# Patient Record
Sex: Female | Born: 1955 | Race: Black or African American | Hispanic: No | Smoking: Never smoker
Health system: Southern US, Community
[De-identification: ages and names within clinical notes are randomized; demographics above are authoritative.]

## PROBLEM LIST (undated history)

## (undated) DIAGNOSIS — R011 Cardiac murmur, unspecified: Secondary | ICD-10-CM

## (undated) DIAGNOSIS — Z923 Personal history of irradiation: Secondary | ICD-10-CM

## (undated) DIAGNOSIS — I10 Essential (primary) hypertension: Secondary | ICD-10-CM

## (undated) DIAGNOSIS — E119 Type 2 diabetes mellitus without complications: Secondary | ICD-10-CM

## (undated) DIAGNOSIS — E7800 Pure hypercholesterolemia, unspecified: Secondary | ICD-10-CM

## (undated) HISTORY — DX: Type 2 diabetes mellitus without complications: E11.9

## (undated) HISTORY — PX: BREAST EXCISIONAL BIOPSY: SUR124

## (undated) HISTORY — DX: Essential (primary) hypertension: I10

## (undated) HISTORY — PX: TUBAL LIGATION: SHX77

## (undated) HISTORY — PX: BREAST LUMPECTOMY: SHX2

## (undated) HISTORY — PX: WISDOM TOOTH EXTRACTION: SHX21

## (undated) HISTORY — PX: ABDOMINAL HYSTERECTOMY: SHX81

---

## 1997-12-25 ENCOUNTER — Ambulatory Visit (HOSPITAL_COMMUNITY): Admission: RE | Admit: 1997-12-25 | Discharge: 1997-12-25 | Payer: Self-pay | Admitting: Family Medicine

## 1998-05-22 ENCOUNTER — Other Ambulatory Visit: Admission: RE | Admit: 1998-05-22 | Discharge: 1998-05-22 | Payer: Self-pay | Admitting: *Deleted

## 1998-12-28 ENCOUNTER — Encounter: Payer: Self-pay | Admitting: Family Medicine

## 1998-12-28 ENCOUNTER — Ambulatory Visit (HOSPITAL_COMMUNITY): Admission: RE | Admit: 1998-12-28 | Discharge: 1998-12-28 | Payer: Self-pay | Admitting: Family Medicine

## 1999-01-01 ENCOUNTER — Ambulatory Visit (HOSPITAL_COMMUNITY): Admission: RE | Admit: 1999-01-01 | Discharge: 1999-01-01 | Payer: Self-pay | Admitting: Family Medicine

## 1999-01-01 ENCOUNTER — Encounter: Payer: Self-pay | Admitting: Family Medicine

## 1999-05-24 ENCOUNTER — Other Ambulatory Visit: Admission: RE | Admit: 1999-05-24 | Discharge: 1999-05-24 | Payer: Self-pay | Admitting: *Deleted

## 2000-01-03 ENCOUNTER — Encounter: Payer: Self-pay | Admitting: Family Medicine

## 2000-01-03 ENCOUNTER — Ambulatory Visit (HOSPITAL_COMMUNITY): Admission: RE | Admit: 2000-01-03 | Discharge: 2000-01-03 | Payer: Self-pay | Admitting: Family Medicine

## 2001-01-04 ENCOUNTER — Ambulatory Visit (HOSPITAL_COMMUNITY): Admission: RE | Admit: 2001-01-04 | Discharge: 2001-01-04 | Payer: Self-pay | Admitting: Family Medicine

## 2001-01-04 ENCOUNTER — Encounter: Payer: Self-pay | Admitting: Family Medicine

## 2001-05-21 ENCOUNTER — Other Ambulatory Visit: Admission: RE | Admit: 2001-05-21 | Discharge: 2001-05-21 | Payer: Self-pay | Admitting: *Deleted

## 2002-01-07 ENCOUNTER — Ambulatory Visit (HOSPITAL_COMMUNITY): Admission: RE | Admit: 2002-01-07 | Discharge: 2002-01-07 | Payer: Self-pay | Admitting: Family Medicine

## 2002-01-07 ENCOUNTER — Encounter: Payer: Self-pay | Admitting: Family Medicine

## 2002-05-30 ENCOUNTER — Other Ambulatory Visit: Admission: RE | Admit: 2002-05-30 | Discharge: 2002-05-30 | Payer: Self-pay | Admitting: *Deleted

## 2003-01-13 ENCOUNTER — Ambulatory Visit (HOSPITAL_COMMUNITY): Admission: RE | Admit: 2003-01-13 | Discharge: 2003-01-13 | Payer: Self-pay | Admitting: Family Medicine

## 2004-01-19 ENCOUNTER — Ambulatory Visit (HOSPITAL_COMMUNITY): Admission: RE | Admit: 2004-01-19 | Discharge: 2004-01-19 | Payer: Self-pay | Admitting: Family Medicine

## 2005-01-20 ENCOUNTER — Ambulatory Visit (HOSPITAL_COMMUNITY): Admission: RE | Admit: 2005-01-20 | Discharge: 2005-01-20 | Payer: Self-pay | Admitting: Family Medicine

## 2006-01-24 ENCOUNTER — Ambulatory Visit (HOSPITAL_COMMUNITY): Admission: RE | Admit: 2006-01-24 | Discharge: 2006-01-24 | Payer: Self-pay | Admitting: Family Medicine

## 2007-01-30 ENCOUNTER — Ambulatory Visit (HOSPITAL_COMMUNITY): Admission: RE | Admit: 2007-01-30 | Discharge: 2007-01-30 | Payer: Self-pay | Admitting: Family Medicine

## 2007-02-02 ENCOUNTER — Encounter: Admission: RE | Admit: 2007-02-02 | Discharge: 2007-02-02 | Payer: Self-pay | Admitting: Family Medicine

## 2008-03-13 ENCOUNTER — Ambulatory Visit (HOSPITAL_COMMUNITY): Admission: RE | Admit: 2008-03-13 | Discharge: 2008-03-13 | Payer: Self-pay | Admitting: Family Medicine

## 2009-03-16 ENCOUNTER — Ambulatory Visit (HOSPITAL_COMMUNITY): Admission: RE | Admit: 2009-03-16 | Discharge: 2009-03-16 | Payer: Self-pay | Admitting: Family Medicine

## 2010-06-10 ENCOUNTER — Other Ambulatory Visit (HOSPITAL_COMMUNITY): Payer: Self-pay | Admitting: Family Medicine

## 2010-06-10 DIAGNOSIS — Z1231 Encounter for screening mammogram for malignant neoplasm of breast: Secondary | ICD-10-CM

## 2010-06-16 ENCOUNTER — Ambulatory Visit (HOSPITAL_COMMUNITY)
Admission: RE | Admit: 2010-06-16 | Discharge: 2010-06-16 | Disposition: A | Discharge status: Home / Self Care | Payer: Self-pay | Source: Ambulatory Visit | Attending: Family Medicine | Admitting: Family Medicine

## 2010-06-16 DIAGNOSIS — Z1231 Encounter for screening mammogram for malignant neoplasm of breast: Secondary | ICD-10-CM

## 2010-08-06 ENCOUNTER — Ambulatory Visit (INDEPENDENT_AMBULATORY_CARE_PROVIDER_SITE_OTHER): Payer: Self-pay

## 2010-08-06 ENCOUNTER — Inpatient Hospital Stay (INDEPENDENT_AMBULATORY_CARE_PROVIDER_SITE_OTHER)
Admission: RE | Admit: 2010-08-06 | Discharge: 2010-08-06 | Disposition: A | Discharge status: Home / Self Care | Payer: Self-pay | Source: Ambulatory Visit | Attending: Family Medicine | Admitting: Family Medicine

## 2010-08-06 DIAGNOSIS — W19XXXA Unspecified fall, initial encounter: Secondary | ICD-10-CM

## 2011-04-28 ENCOUNTER — Other Ambulatory Visit (HOSPITAL_COMMUNITY): Payer: Self-pay | Admitting: Family Medicine

## 2011-04-28 DIAGNOSIS — Z1231 Encounter for screening mammogram for malignant neoplasm of breast: Secondary | ICD-10-CM

## 2011-06-21 ENCOUNTER — Ambulatory Visit (HOSPITAL_COMMUNITY): Payer: Self-pay

## 2011-06-24 ENCOUNTER — Ambulatory Visit (HOSPITAL_COMMUNITY)
Admission: RE | Admit: 2011-06-24 | Discharge: 2011-06-24 | Disposition: A | Discharge status: Home / Self Care | Payer: Self-pay | Source: Ambulatory Visit | Attending: Family Medicine | Admitting: Family Medicine

## 2011-06-24 DIAGNOSIS — Z1231 Encounter for screening mammogram for malignant neoplasm of breast: Secondary | ICD-10-CM

## 2011-06-29 ENCOUNTER — Other Ambulatory Visit: Payer: Self-pay | Admitting: Family Medicine

## 2011-06-29 DIAGNOSIS — R928 Other abnormal and inconclusive findings on diagnostic imaging of breast: Secondary | ICD-10-CM

## 2011-07-22 ENCOUNTER — Ambulatory Visit
Admission: RE | Admit: 2011-07-22 | Discharge: 2011-07-22 | Disposition: A | Discharge status: Home / Self Care | Payer: No Typology Code available for payment source | Source: Ambulatory Visit | Attending: Family Medicine | Admitting: Family Medicine

## 2011-07-22 DIAGNOSIS — R928 Other abnormal and inconclusive findings on diagnostic imaging of breast: Secondary | ICD-10-CM

## 2014-01-23 ENCOUNTER — Other Ambulatory Visit (HOSPITAL_COMMUNITY): Payer: Self-pay | Admitting: Family Medicine

## 2014-01-23 DIAGNOSIS — Z1231 Encounter for screening mammogram for malignant neoplasm of breast: Secondary | ICD-10-CM

## 2014-01-28 ENCOUNTER — Ambulatory Visit (HOSPITAL_COMMUNITY)
Admission: RE | Admit: 2014-01-28 | Discharge: 2014-01-28 | Disposition: A | Discharge status: Home / Self Care | Payer: Self-pay | Source: Ambulatory Visit | Attending: Family Medicine | Admitting: Family Medicine

## 2014-01-28 DIAGNOSIS — Z1231 Encounter for screening mammogram for malignant neoplasm of breast: Secondary | ICD-10-CM

## 2016-02-03 ENCOUNTER — Other Ambulatory Visit: Payer: Self-pay | Admitting: Family Medicine

## 2016-02-03 DIAGNOSIS — Z23 Encounter for immunization: Secondary | ICD-10-CM | POA: Diagnosis not present

## 2016-02-03 DIAGNOSIS — I10 Essential (primary) hypertension: Secondary | ICD-10-CM | POA: Diagnosis not present

## 2016-02-03 DIAGNOSIS — E7800 Pure hypercholesterolemia, unspecified: Secondary | ICD-10-CM | POA: Diagnosis not present

## 2016-02-03 DIAGNOSIS — Z0000 Encounter for general adult medical examination without abnormal findings: Secondary | ICD-10-CM | POA: Diagnosis not present

## 2016-02-03 DIAGNOSIS — Z1231 Encounter for screening mammogram for malignant neoplasm of breast: Secondary | ICD-10-CM

## 2016-03-01 ENCOUNTER — Ambulatory Visit
Admission: RE | Admit: 2016-03-01 | Discharge: 2016-03-01 | Disposition: A | Discharge status: Home / Self Care | Payer: Self-pay | Source: Ambulatory Visit | Attending: Family Medicine | Admitting: Family Medicine

## 2016-03-01 DIAGNOSIS — Z1231 Encounter for screening mammogram for malignant neoplasm of breast: Secondary | ICD-10-CM | POA: Diagnosis not present

## 2016-05-18 DIAGNOSIS — I10 Essential (primary) hypertension: Secondary | ICD-10-CM | POA: Diagnosis not present

## 2016-06-29 DIAGNOSIS — I10 Essential (primary) hypertension: Secondary | ICD-10-CM | POA: Diagnosis not present

## 2017-02-16 DIAGNOSIS — E7800 Pure hypercholesterolemia, unspecified: Secondary | ICD-10-CM | POA: Diagnosis not present

## 2017-02-16 DIAGNOSIS — Z0000 Encounter for general adult medical examination without abnormal findings: Secondary | ICD-10-CM | POA: Diagnosis not present

## 2017-02-16 DIAGNOSIS — I10 Essential (primary) hypertension: Secondary | ICD-10-CM | POA: Diagnosis not present

## 2017-02-16 DIAGNOSIS — K625 Hemorrhage of anus and rectum: Secondary | ICD-10-CM | POA: Diagnosis not present

## 2017-03-22 DIAGNOSIS — R7301 Impaired fasting glucose: Secondary | ICD-10-CM | POA: Diagnosis not present

## 2017-03-22 DIAGNOSIS — J069 Acute upper respiratory infection, unspecified: Secondary | ICD-10-CM | POA: Diagnosis not present

## 2017-08-21 DIAGNOSIS — E1165 Type 2 diabetes mellitus with hyperglycemia: Secondary | ICD-10-CM | POA: Diagnosis not present

## 2017-08-21 DIAGNOSIS — E7800 Pure hypercholesterolemia, unspecified: Secondary | ICD-10-CM | POA: Diagnosis not present

## 2017-08-21 DIAGNOSIS — I10 Essential (primary) hypertension: Secondary | ICD-10-CM | POA: Diagnosis not present

## 2017-09-22 DIAGNOSIS — K640 First degree hemorrhoids: Secondary | ICD-10-CM | POA: Diagnosis not present

## 2017-09-22 DIAGNOSIS — Z1211 Encounter for screening for malignant neoplasm of colon: Secondary | ICD-10-CM | POA: Diagnosis not present

## 2017-12-07 ENCOUNTER — Other Ambulatory Visit: Payer: Self-pay | Admitting: Family Medicine

## 2017-12-07 DIAGNOSIS — Z23 Encounter for immunization: Secondary | ICD-10-CM | POA: Diagnosis not present

## 2017-12-07 DIAGNOSIS — Z1231 Encounter for screening mammogram for malignant neoplasm of breast: Secondary | ICD-10-CM

## 2017-12-07 DIAGNOSIS — E7800 Pure hypercholesterolemia, unspecified: Secondary | ICD-10-CM | POA: Diagnosis not present

## 2017-12-07 DIAGNOSIS — E1165 Type 2 diabetes mellitus with hyperglycemia: Secondary | ICD-10-CM | POA: Diagnosis not present

## 2017-12-07 DIAGNOSIS — I10 Essential (primary) hypertension: Secondary | ICD-10-CM | POA: Diagnosis not present

## 2018-01-22 ENCOUNTER — Ambulatory Visit
Admission: RE | Admit: 2018-01-22 | Discharge: 2018-01-22 | Disposition: A | Discharge status: Home / Self Care | Payer: BLUE CROSS/BLUE SHIELD | Source: Ambulatory Visit | Attending: Family Medicine | Admitting: Family Medicine

## 2018-01-22 DIAGNOSIS — Z1231 Encounter for screening mammogram for malignant neoplasm of breast: Secondary | ICD-10-CM

## 2018-03-14 DIAGNOSIS — E785 Hyperlipidemia, unspecified: Secondary | ICD-10-CM | POA: Diagnosis not present

## 2018-03-14 DIAGNOSIS — E119 Type 2 diabetes mellitus without complications: Secondary | ICD-10-CM | POA: Diagnosis not present

## 2018-03-14 DIAGNOSIS — Z0000 Encounter for general adult medical examination without abnormal findings: Secondary | ICD-10-CM | POA: Diagnosis not present

## 2018-03-14 DIAGNOSIS — I10 Essential (primary) hypertension: Secondary | ICD-10-CM | POA: Diagnosis not present

## 2018-09-12 DIAGNOSIS — E785 Hyperlipidemia, unspecified: Secondary | ICD-10-CM | POA: Diagnosis not present

## 2018-09-12 DIAGNOSIS — I10 Essential (primary) hypertension: Secondary | ICD-10-CM | POA: Diagnosis not present

## 2018-09-12 DIAGNOSIS — E119 Type 2 diabetes mellitus without complications: Secondary | ICD-10-CM | POA: Diagnosis not present

## 2018-09-26 DIAGNOSIS — I10 Essential (primary) hypertension: Secondary | ICD-10-CM | POA: Diagnosis not present

## 2018-12-24 ENCOUNTER — Other Ambulatory Visit: Payer: Self-pay | Admitting: Family Medicine

## 2018-12-24 DIAGNOSIS — Z1231 Encounter for screening mammogram for malignant neoplasm of breast: Secondary | ICD-10-CM

## 2019-02-14 ENCOUNTER — Ambulatory Visit
Admission: RE | Admit: 2019-02-14 | Discharge: 2019-02-14 | Disposition: A | Discharge status: Home / Self Care | Payer: BC Managed Care – PPO | Source: Ambulatory Visit | Attending: Family Medicine | Admitting: Family Medicine

## 2019-02-14 ENCOUNTER — Other Ambulatory Visit: Payer: Self-pay

## 2019-02-14 DIAGNOSIS — Z1231 Encounter for screening mammogram for malignant neoplasm of breast: Secondary | ICD-10-CM

## 2019-03-20 DIAGNOSIS — I10 Essential (primary) hypertension: Secondary | ICD-10-CM | POA: Diagnosis not present

## 2019-03-20 DIAGNOSIS — E1165 Type 2 diabetes mellitus with hyperglycemia: Secondary | ICD-10-CM | POA: Diagnosis not present

## 2019-03-20 DIAGNOSIS — Z0000 Encounter for general adult medical examination without abnormal findings: Secondary | ICD-10-CM | POA: Diagnosis not present

## 2019-03-20 DIAGNOSIS — E7800 Pure hypercholesterolemia, unspecified: Secondary | ICD-10-CM | POA: Diagnosis not present

## 2020-01-06 ENCOUNTER — Other Ambulatory Visit: Payer: Self-pay | Admitting: Family Medicine

## 2020-01-06 DIAGNOSIS — Z1231 Encounter for screening mammogram for malignant neoplasm of breast: Secondary | ICD-10-CM

## 2020-02-20 ENCOUNTER — Ambulatory Visit
Admission: RE | Admit: 2020-02-20 | Discharge: 2020-02-20 | Disposition: A | Discharge status: Home / Self Care | Payer: BC Managed Care – PPO | Source: Ambulatory Visit | Attending: Family Medicine | Admitting: Family Medicine

## 2020-02-20 ENCOUNTER — Other Ambulatory Visit: Payer: Self-pay

## 2020-02-20 DIAGNOSIS — Z1231 Encounter for screening mammogram for malignant neoplasm of breast: Secondary | ICD-10-CM

## 2021-02-14 DIAGNOSIS — C50919 Malignant neoplasm of unspecified site of unspecified female breast: Secondary | ICD-10-CM

## 2021-02-14 HISTORY — DX: Malignant neoplasm of unspecified site of unspecified female breast: C50.919

## 2021-04-07 ENCOUNTER — Other Ambulatory Visit: Payer: Self-pay | Admitting: Family Medicine

## 2021-04-07 DIAGNOSIS — Z1231 Encounter for screening mammogram for malignant neoplasm of breast: Secondary | ICD-10-CM

## 2021-04-12 ENCOUNTER — Ambulatory Visit
Admission: RE | Admit: 2021-04-12 | Discharge: 2021-04-12 | Disposition: A | Discharge status: Home / Self Care | Payer: Managed Care, Other (non HMO) | Source: Ambulatory Visit | Attending: Family Medicine | Admitting: Family Medicine

## 2021-04-12 DIAGNOSIS — Z1231 Encounter for screening mammogram for malignant neoplasm of breast: Secondary | ICD-10-CM

## 2021-04-13 ENCOUNTER — Other Ambulatory Visit: Payer: Self-pay | Admitting: Family Medicine

## 2021-04-13 DIAGNOSIS — R928 Other abnormal and inconclusive findings on diagnostic imaging of breast: Secondary | ICD-10-CM

## 2021-04-27 ENCOUNTER — Other Ambulatory Visit: Payer: Managed Care, Other (non HMO)

## 2021-05-25 ENCOUNTER — Ambulatory Visit: Payer: Managed Care, Other (non HMO)

## 2021-05-25 ENCOUNTER — Ambulatory Visit
Admission: RE | Admit: 2021-05-25 | Discharge: 2021-05-25 | Disposition: A | Discharge status: Home / Self Care | Payer: Managed Care, Other (non HMO) | Source: Ambulatory Visit | Attending: Family Medicine | Admitting: Family Medicine

## 2021-05-25 ENCOUNTER — Other Ambulatory Visit: Payer: Self-pay | Admitting: Family Medicine

## 2021-05-25 DIAGNOSIS — N6489 Other specified disorders of breast: Secondary | ICD-10-CM

## 2021-05-25 DIAGNOSIS — R928 Other abnormal and inconclusive findings on diagnostic imaging of breast: Secondary | ICD-10-CM

## 2021-06-07 ENCOUNTER — Other Ambulatory Visit: Payer: Managed Care, Other (non HMO)

## 2021-06-11 ENCOUNTER — Ambulatory Visit
Admission: RE | Admit: 2021-06-11 | Discharge: 2021-06-11 | Disposition: A | Discharge status: Home / Self Care | Payer: Managed Care, Other (non HMO) | Source: Ambulatory Visit | Attending: Family Medicine | Admitting: Family Medicine

## 2021-06-11 DIAGNOSIS — N6489 Other specified disorders of breast: Secondary | ICD-10-CM

## 2021-06-11 HISTORY — PX: BREAST BIOPSY: SHX20

## 2021-06-14 ENCOUNTER — Other Ambulatory Visit: Payer: Self-pay | Admitting: Family Medicine

## 2021-06-14 DIAGNOSIS — C50911 Malignant neoplasm of unspecified site of right female breast: Secondary | ICD-10-CM

## 2021-06-15 ENCOUNTER — Telehealth: Payer: Self-pay | Admitting: Hematology and Oncology

## 2021-06-15 NOTE — Telephone Encounter (Signed)
Spoke to patient to confirm morning clinic appointment for 5/10, packet will be sent via mail ?

## 2021-06-21 ENCOUNTER — Encounter: Payer: Self-pay | Admitting: *Deleted

## 2021-06-21 DIAGNOSIS — C50411 Malignant neoplasm of upper-outer quadrant of right female breast: Secondary | ICD-10-CM

## 2021-06-22 ENCOUNTER — Ambulatory Visit
Admission: RE | Admit: 2021-06-22 | Discharge: 2021-06-22 | Disposition: A | Discharge status: Home / Self Care | Payer: Commercial Managed Care - HMO | Source: Ambulatory Visit | Attending: Family Medicine | Admitting: Family Medicine

## 2021-06-22 DIAGNOSIS — C50911 Malignant neoplasm of unspecified site of right female breast: Secondary | ICD-10-CM

## 2021-06-22 NOTE — Progress Notes (Signed)
?Radiation Oncology         [phone] ?________________________________ ? ?Multidisciplinary Breast Oncology Clinic [facility]) ?Initial Outpatient Consultation ? ?Name: Alexandria Hoover MRN: [id number]  ?Date: 06/23/2021  DOB: 01-Jan-1956 ? ?HE:RDEY, Arbie Cookey, MD  Rolm Bookbinder, MD  ? ?REFERRING PHYSICIAN: Rolm Bookbinder, MD ? ?DIAGNOSIS: The encounter diagnosis was Malignant neoplasm of upper-outer quadrant of right breast in female, estrogen receptor positive ([place]). ? 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 ?  ICD-10-CM   ?1. Malignant neoplasm of upper-outer quadrant of right breast in female, estrogen receptor positive ([place])  C50.411   ? Z17.0   ?  ? ? ?HISTORY OF PRESENT ILLNESS::Alexandria Hoover is a 66 y.o. female who is presenting to the office today for evaluation of her newly diagnosed breast cancer. She is accompanied by no one. She is doing well overall.  ? ?She had routine screening mammography on 04/12/21 showing a possible abnormality in the right breast. She underwent unilateral right diagnostic mammography with tomography at [facility] on 05/25/21 showing: an indeterminate architectural distortion involving the upper outer quadrant of the right breast, middle depth, adjacent to a group of benign calcifications. ? ?Biopsy of the lateral right breast on 06/11/21 showed grade 1 invasive mammary carcinoma measuring 0.2 cm in the greatest linear extent, with mammary carcinoma in-situ. Prognostic indicators significant for: estrogen receptor, 100% positive and progesterone receptor, 100% positive, both with strong staining intensity. Proliferation marker Ki67 at 2%. HER2 negative. ? ?Menarche: not indicated ?LMP: not indicated ?Contraceptive: never used ?HRT: never used  ? ?The patient was referred today for presentation in the multidisciplinary conference.  Radiology studies and pathology slides were presented there for  review and discussion of treatment options.  A consensus was discussed regarding potential next steps. ? ?PREVIOUS RADIATION THERAPY: No ? ?PAST MEDICAL HISTORY:  ?Past Medical History:  ?Diagnosis Date  ? Diabetes ([place])   ? Hypertension   ? ? ?PAST SURGICAL HISTORY: ?Past Surgical History:  ?Procedure Laterality Date  ? ABDOMINAL HYSTERECTOMY    ? BREAST EXCISIONAL BIOPSY Right over 10 yrs  ? ? ?FAMILY HISTORY: sister with breast, diagnosed in her 27's ?Family History  ?Problem Relation Age of Onset  ? Throat cancer Father   ? Breast cancer Sister   ? Throat cancer Maternal Uncle   ? Pancreatic cancer Maternal Uncle   ? ? ?SOCIAL HISTORY:  ?Social History  ? ?Socioeconomic History  ? Marital status: Divorced  ?  Spouse name: Not on file  ? Number of children: Not on file  ? Years of education: Not on file  ? Highest education level: Not on file  ?Occupational History  ? Not on file  ?Tobacco Use  ? Smoking status: Never  ? Smokeless tobacco: Not on file  ?Substance and Sexual Activity  ? Alcohol use: Never  ? Drug use: Never  ? Sexual activity: Not on file  ?Other Topics Concern  ? Not on file  ?Social History Narrative  ? Not on file  ? ?Social Determinants of Health  ? ?Financial Resource Strain: Medium Risk  ? Difficulty of Paying Living Expenses: Somewhat hard  ?Food Insecurity: No Food Insecurity  ? Worried About Charity fundraiser in the Last Year: Never true  ? Ran Out of Food in the Last Year: Never true  ?Transportation Needs: No Transportation Needs  ? Lack of Transportation (Medical): No  ? Lack of  Transportation (Non-Medical): No  ?Physical Activity: Not on file  ?Stress: Not on file  ?Social Connections: Not on file  ? ? ?ALLERGIES:  ?Allergies  ?Allergen Reactions  ? Codeine Nausea And Vomiting  ? Lisinopril Other (See Comments)  ?  cough  ? ? ?MEDICATIONS:  ?Current Outpatient Medications  ?Medication Sig Dispense Refill  ? amLODipine (NORVASC) 10 MG tablet 1 tablet    ? cholecalciferol (VITAMIN D)  25 MCG (1000 UNIT) tablet 1 tablet    ? loratadine (CLARITIN) 10 MG tablet 1 tablet    ? losartan-hydrochlorothiazide (HYZAAR) 100-25 MG tablet Take 1 tablet by mouth daily.    ? metFORMIN (GLUCOPHAGE-XR) 500 MG 24 hr tablet SMARTSIG:1 Tablet(s) By Mouth Every Evening    ? pravastatin (PRAVACHOL) 40 MG tablet 1 tablet    ? ?No current facility-administered medications for this encounter.  ? ? ?REVIEW OF SYSTEMS: A 10+ POINT REVIEW OF SYSTEMS WAS OBTAINED including neurology, dermatology, psychiatry, cardiac, respiratory, lymph, extremities, GI, GU, musculoskeletal, constitutional, reproductive, HEENT. On the provided form, she reports night sweats, wearing glasses, feet swelling, sleeping with 2 pillows, a breast lump, breast dimpling, diabetes, and hot flashes. She denies any other symptoms.  ?  ?PHYSICAL EXAM:   ? ? 06/23/2021  ?Vitals with BMI   ?Height _0    ?Weight 194 lbs 14 oz   ?BMI 33.44   ?Systolic 616 !   ?Diastolic 69 !   ?Pulse 80   ?  ? ? ?Lungs are clear to auscultation bilaterally. Heart has regular rate and rhythm. No palpable cervical, supraclavicular, or axillary adenopathy. Abdomen soft, non-tender, normal bowel sounds. ?Breast: Left breast with no palpable mass, nipple discharge, or bleeding. Right breast with a scar in the upper inner aspect of the breast from a previous benign biopsy. Patient also has induration and biopsy changes in the upper outer quadrant with some erythema consistent with biopsy changes.  ? ?KPS = 90 ? ?100 - Normal; no complaints; no evidence of disease. ?90   - Able to carry on normal activity; minor signs or symptoms of disease. ?80   - Normal activity with effort; some signs or symptoms of disease. ?52   - Cares for self; unable to carry on normal activity or to do active work. ?60   - Requires occasional assistance, but is able to care for most of his personal needs. ?50   - Requires considerable assistance and frequent medical care. ?29   - Disabled; requires  special care and assistance. ?30   - Severely disabled; hospital admission is indicated although death not imminent. ?20   - Very sick; hospital admission necessary; active supportive treatment necessary. ?10   - Moribund; fatal processes progressing rapidly. ?0     - Dead ? ?Karnofsky DA, Abelmann WH, Craver LS and Burchenal [facility] [phone]) The use of the nitrogen mustards in the palliative treatment of carcinoma: with particular reference to bronchogenic carcinoma Cancer 1 [phone] ? ?LABORATORY DATA:  ?Lab Results  ?Component Value Date  ? WBC 7.0 06/23/2021  ? HGB 12.3 06/23/2021  ? HCT 39.5 06/23/2021  ? MCV 77.3 (L) 06/23/2021  ? PLT 361 06/23/2021  ? ?Lab Results  ?Component Value Date  ? NA 140 06/23/2021  ? K 3.3 (L) 06/23/2021  ? CL 103 06/23/2021  ? CO2 27 06/23/2021  ? ?Lab Results  ?Component Value Date  ? ALT 19 06/23/2021  ? AST 15 06/23/2021  ? ALKPHOS 68 06/23/2021  ? BILITOT 0.4 06/23/2021  ? ? ?  PULMONARY FUNCTION TEST:   ?Review Flowsheet   ? ?    ? View : No data to display.  ?  ?  ?  ?  ?  ? ? ?RADIOGRAPHY: MM DIAG BREAST TOMO UNI RIGHT ? ?Addendum Date: 06/22/2021   ?ADDENDUM REPORT: 06/22/2021 10:50 ADDENDUM: The architectural distortion in the UPPER-OUTER QUADRANT of the RIGHT breast is estimated to measure 1.7 centimeters. Electronically Signed   By: Nolon Nations M.D.   On: 06/22/2021 10:50  ? ?Result Date: 06/22/2021 ?CLINICAL DATA:  Recall from screening mammography, possible architectural distortion involving the upper outer RIGHT breast. Personal history of benign excisional biopsy from the RIGHT breast many years ago. EXAM: DIGITAL DIAGNOSTIC UNILATERAL RIGHT MAMMOGRAM WITH TOMOSYNTHESIS AND CAD TECHNIQUE: Right digital diagnostic mammography and breast tomosynthesis was performed. The images were evaluated with computer-aided detection. COMPARISON:  Previous exam(s). ACR Breast Density Category c: The breast tissue is heterogeneously dense, which may obscure small masses. FINDINGS:  Spot-compression CC and MLO views of the area of concern in the full field mediolateral view were obtained. A linear opaque marker was placed on the patient's skin at the site of the surgical scar. Persistent architectural

## 2021-06-23 ENCOUNTER — Ambulatory Visit: Payer: Medicare Other | Attending: General Surgery | Admitting: Physical Therapy

## 2021-06-23 ENCOUNTER — Inpatient Hospital Stay (HOSPITAL_BASED_OUTPATIENT_CLINIC_OR_DEPARTMENT_OTHER): Payer: Medicare Other | Admitting: Hematology and Oncology

## 2021-06-23 ENCOUNTER — Inpatient Hospital Stay: Payer: Medicare Other | Attending: Hematology and Oncology

## 2021-06-23 ENCOUNTER — Other Ambulatory Visit: Payer: Self-pay

## 2021-06-23 ENCOUNTER — Encounter: Payer: Self-pay | Admitting: *Deleted

## 2021-06-23 ENCOUNTER — Encounter: Payer: Self-pay | Admitting: Physical Therapy

## 2021-06-23 ENCOUNTER — Inpatient Hospital Stay: Payer: Medicare Other | Admitting: Licensed Clinical Social Worker

## 2021-06-23 ENCOUNTER — Other Ambulatory Visit: Payer: Self-pay | Admitting: *Deleted

## 2021-06-23 ENCOUNTER — Inpatient Hospital Stay (HOSPITAL_BASED_OUTPATIENT_CLINIC_OR_DEPARTMENT_OTHER): Payer: Medicare Other | Admitting: Genetic Counselor

## 2021-06-23 ENCOUNTER — Other Ambulatory Visit: Payer: Self-pay | Admitting: General Surgery

## 2021-06-23 ENCOUNTER — Encounter: Payer: Self-pay | Admitting: Hematology and Oncology

## 2021-06-23 ENCOUNTER — Ambulatory Visit: Payer: Managed Care, Other (non HMO) | Admitting: Radiation Oncology

## 2021-06-23 ENCOUNTER — Ambulatory Visit
Admission: RE | Admit: 2021-06-23 | Discharge: 2021-06-23 | Disposition: A | Discharge status: Home / Self Care | Payer: Commercial Managed Care - HMO | Source: Ambulatory Visit | Attending: Radiation Oncology | Admitting: Radiation Oncology

## 2021-06-23 DIAGNOSIS — M25611 Stiffness of right shoulder, not elsewhere classified: Secondary | ICD-10-CM | POA: Diagnosis present

## 2021-06-23 DIAGNOSIS — R293 Abnormal posture: Secondary | ICD-10-CM | POA: Diagnosis present

## 2021-06-23 DIAGNOSIS — Z808 Family history of malignant neoplasm of other organs or systems: Secondary | ICD-10-CM | POA: Diagnosis not present

## 2021-06-23 DIAGNOSIS — Z79899 Other long term (current) drug therapy: Secondary | ICD-10-CM

## 2021-06-23 DIAGNOSIS — Z170 Estrogen receptor positive status [ER+]: Secondary | ICD-10-CM | POA: Diagnosis present

## 2021-06-23 DIAGNOSIS — C50411 Malignant neoplasm of upper-outer quadrant of right female breast: Secondary | ICD-10-CM

## 2021-06-23 DIAGNOSIS — M25612 Stiffness of left shoulder, not elsewhere classified: Secondary | ICD-10-CM | POA: Diagnosis present

## 2021-06-23 DIAGNOSIS — Z8042 Family history of malignant neoplasm of prostate: Secondary | ICD-10-CM | POA: Diagnosis not present

## 2021-06-23 DIAGNOSIS — Z803 Family history of malignant neoplasm of breast: Secondary | ICD-10-CM | POA: Insufficient documentation

## 2021-06-23 DIAGNOSIS — Z800 Family history of malignant neoplasm of digestive organs: Secondary | ICD-10-CM | POA: Insufficient documentation

## 2021-06-23 LAB — CBC WITH DIFFERENTIAL (CANCER CENTER ONLY)
Abs Immature Granulocytes: 0.01 10*3/uL (ref 0.00–0.07)
Basophils Absolute: 0 10*3/uL (ref 0.0–0.1)
Basophils Relative: 0 %
Eosinophils Absolute: 0.4 10*3/uL (ref 0.0–0.5)
Eosinophils Relative: 5 %
HCT: 39.5 % (ref 36.0–46.0)
Hemoglobin: 12.3 g/dL (ref 12.0–15.0)
Immature Granulocytes: 0 %
Lymphocytes Relative: 35 %
Lymphs Abs: 2.4 10*3/uL (ref 0.7–4.0)
MCH: 24.1 pg — ABNORMAL LOW (ref 26.0–34.0)
MCHC: 31.1 g/dL (ref 30.0–36.0)
MCV: 77.3 fL — ABNORMAL LOW (ref 80.0–100.0)
Monocytes Absolute: 0.5 10*3/uL (ref 0.1–1.0)
Monocytes Relative: 7 %
Neutro Abs: 3.7 10*3/uL (ref 1.7–7.7)
Neutrophils Relative %: 53 %
Platelet Count: 361 10*3/uL (ref 150–400)
RBC: 5.11 MIL/uL (ref 3.87–5.11)
RDW: 15.6 % — ABNORMAL HIGH (ref 11.5–15.5)
WBC Count: 7 10*3/uL (ref 4.0–10.5)
nRBC: 0 % (ref 0.0–0.2)

## 2021-06-23 LAB — GENETIC SCREENING ORDER

## 2021-06-23 LAB — CMP (CANCER CENTER ONLY)
ALT: 19 U/L (ref 0–44)
AST: 15 U/L (ref 15–41)
Albumin: 4.6 g/dL (ref 3.5–5.0)
Alkaline Phosphatase: 68 U/L (ref 38–126)
Anion gap: 10 (ref 5–15)
BUN: 22 mg/dL (ref 8–23)
CO2: 27 mmol/L (ref 22–32)
Calcium: 10.5 mg/dL — ABNORMAL HIGH (ref 8.9–10.3)
Chloride: 103 mmol/L (ref 98–111)
Creatinine: 1 mg/dL (ref 0.44–1.00)
GFR, Estimated: >60 mL/min (ref >60)
Glucose, Bld: 165 mg/dL — ABNORMAL HIGH (ref 70–99)
Potassium: 3.3 mmol/L — ABNORMAL LOW (ref 3.5–5.1)
Sodium: 140 mmol/L (ref 135–145)
Total Bilirubin: 0.4 mg/dL (ref 0.3–1.2)
Total Protein: 8.4 g/dL — ABNORMAL HIGH (ref 6.5–8.1)

## 2021-06-23 NOTE — Assessment & Plan Note (Addendum)
This is a very pleasant 66 year old postmenopausal female patient with newly diagnosed right breast invasive ductal carcinoma, grade 1 ER/PR positive, KI of 2%, HER2 FISH pending referred to breast [place] for recommendations.  Given small tumor, architectural distortion measuring about 1.7 cm, strong ER/PR positivity, HER2 FISH pending, it is reasonable to proceed with upfront surgery provided HER2 remains negative. ? ?Postsurgery, we can consider Oncotype testing and we have discussed the following details about Oncotype. We have discussed about Oncotype Dx score which is a well validated prognostic scoring system which can predict outcome with endocrine therapy alone and whether chemotherapy reduces recurrence.  Typically in patients with ER positive cancers that are node negative if the RS score is high typically greater than or equal to 26, chemotherapy is recommended.  ?In women with intermediate recurrence score younger than 23, there can still be some role for chemotherapy in addition to endocrine therapy especially if the recurrence score is between 21-25. ?  Given her pathology, it is very less likely that she will need any adjuvant chemotherapy but she is interested in Oncotype testing to know more about prognostic information. ? ?We have discussed about considering antiestrogen therapy after completion of adjuvant radiation.  We have discussed the following details about antiestrogen therapy. ? ?We have discussed options for antiestrogen therapy today ? ?With regards to Tamoxifen, we discussed that this is a SERM, selective estrogen receptor modulator. We discussed mechanism of action of Tamoxifen, adverse effects on Tamoxifen including but not limited to post menopausal symptoms, increased risk of DVT/PE, increased risk of endometrial cancer, questionable cataracts with long term use and increased risk of cardiovascular events in the study which was not statistically significant. A benefit from Tamoxifen  would be improvement in bone density. ? ?With regards to aromatase inhibitors, we discussed mechanism of action, adverse effects including but not limited to post menopausal symptoms, arthralgias, myalgias, increased risk of cardiovascular events and bone loss.  ? ?She will return to clinic after surgery to discuss pathology results as well as Oncotype results. ? ?She does not have any preference for the type of antiestrogen therapy.  We will discuss more after she completes adjuvant radiation. ? ? ? ?

## 2021-06-23 NOTE — Progress Notes (Signed)
[facility] ?CONSULT NOTE ? ?Patient Care Team: ?Maurice Small, MD as PCP - General (Family Medicine) ?Rolm Bookbinder, MD as Consulting Physician (General Surgery) ?Benay Pike, MD as Consulting Physician (Hematology and Oncology) ?Gery Pray, MD as Consulting Physician (Radiation Oncology) ?Mauro Kaufmann, RN as Oncology Nurse Navigator ?Rockwell Germany, RN as Oncology Nurse Navigator ? ?CHIEF COMPLAINTS/PURPOSE OF CONSULTATION:  ?Newly diagnosed breast cancer ? ?HISTORY OF PRESENTING ILLNESS:  ?Alexandria Hoover 66 y.o. female is here because of recent diagnosis of right IDC breast cancer ? ?I reviewed her records extensively and collaborated the history with the patient. ? ?SUMMARY OF ONCOLOGIC HISTORY: ?Oncology History  ?Malignant neoplasm of upper-outer quadrant of right breast in female, estrogen receptor positive ([place])  ?04/12/2021 Mammogram  ? Mammogram from April 12, 2021 showed possible distortion in the right breast and additional imaging was recommended. ?Diagnostic mammogram confirmed indeterminate architectural distortion involving the upper outer quadrant of the right breast at middle depth adjacent to a group of benign calcifications.  Stereotactic tomosynthesis core needle biopsy of the right breast was recommended ?  ?06/11/2021 Pathology Results  ? Pathology from the breast biopsy showed invasive mammary carcinoma ductal subtype, grade 1.  Prognostic showed ER 100% positive strong staining PR 100% positive strong staining, KI of 2% and HER2 by FISH pending ?  ?06/21/2021 Initial Diagnosis  ? Malignant neoplasm of upper-outer quadrant of right breast in female, estrogen receptor positive ([place]) ?  ? ? ?MEDICAL HISTORY:  ?Past Medical History:  ?Diagnosis Date  ? Diabetes ([place])   ? Hypertension   ? ? ?SURGICAL HISTORY: ?Past Surgical History:  ?Procedure Laterality Date  ? ABDOMINAL HYSTERECTOMY    ? BREAST EXCISIONAL BIOPSY Right over 10 yrs  ? ? ?SOCIAL HISTORY: ?Social  History  ? ?Socioeconomic History  ? Marital status: Divorced  ?  Spouse name: Not on file  ? Number of children: Not on file  ? Years of education: Not on file  ? Highest education level: Not on file  ?Occupational History  ? Not on file  ?Tobacco Use  ? Smoking status: Never  ? Smokeless tobacco: Not on file  ?Substance and Sexual Activity  ? Alcohol use: Never  ? Drug use: Never  ? Sexual activity: Not on file  ?Other Topics Concern  ? Not on file  ?Social History Narrative  ? Not on file  ? ?Social Determinants of Health  ? ?Financial Resource Strain: Not on file  ?Food Insecurity: Not on file  ?Transportation Needs: Not on file  ?Physical Activity: Not on file  ?Stress: Not on file  ?Social Connections: Not on file  ?Intimate Partner Violence: Not on file  ? ? ?FAMILY HISTORY: ?Family History  ?Problem Relation Age of Onset  ? Throat cancer Father   ? Breast cancer Sister   ? Throat cancer Maternal Uncle   ? Pancreatic cancer Maternal Uncle   ? ? ?ALLERGIES:  is allergic to codeine and lisinopril. ? ?MEDICATIONS:  ?Current Outpatient Medications  ?Medication Sig Dispense Refill  ? cholecalciferol (VITAMIN D) 25 MCG (1000 UNIT) tablet 1 tablet    ? amLODipine (NORVASC) 10 MG tablet 1 tablet    ? loratadine (CLARITIN) 10 MG tablet 1 tablet    ? losartan-hydrochlorothiazide (HYZAAR) 100-25 MG tablet Take 1 tablet by mouth daily.    ? metFORMIN (GLUCOPHAGE-XR) 500 MG 24 hr tablet SMARTSIG:1 Tablet(s) By Mouth Every Evening    ? pravastatin (PRAVACHOL) 40 MG tablet 1 tablet    ? ?  No current facility-administered medications for this visit.  ? ? ?REVIEW OF SYSTEMS:   ?Constitutional: Denies fevers, chills or abnormal night sweats ?Eyes: Denies blurriness of vision, double vision or watery eyes ?Ears, nose, mouth, throat, and face: Denies mucositis or sore throat ?Respiratory: Denies cough, dyspnea or wheezes ?Cardiovascular: Denies palpitation, chest discomfort or lower extremity swelling ?Gastrointestinal:  Denies  nausea, heartburn or change in bowel habits ?Skin: Denies abnormal skin rashes ?Lymphatics: Denies new lymphadenopathy or easy bruising ?Neurological:Denies numbness, tingling or new weaknesses ?Behavioral/Psych: Mood is stable, no new changes  ?Breast: Denies any palpable lumps or discharge ?All other systems were reviewed with the patient and are negative. ? ?PHYSICAL EXAMINATION: ?ECOG PERFORMANCE STATUS: 0 - Asymptomatic ? ?Vitals:  ? 06/23/21 0853  ?BP: (!) 153/69  ?Pulse: 80  ?Resp: 16  ?Temp: 97.6 ?F (36.4 ?C)  ?SpO2: 97%  ? ?Filed Weights  ? 06/23/21 0853  ?Weight: 194 lb 14.2 oz (88.4 kg)  ? ? ?Physical Exam ?Constitutional:   ?   Appearance: Normal appearance.  ?Chest:  ? ? ?   Comments: Right breast upper outer quadrant mass measuring about 4 *3 cms, easily movable over pectoralis, skin pinchable. ?No palpable axillary LN. ?Since the tumor is smaller on imaging, some of this could represent hematoma ?Musculoskeletal:     ?   General: No swelling or tenderness.  ?   Cervical back: Normal range of motion and neck supple. No rigidity.  ?Lymphadenopathy:  ?   Cervical: No cervical adenopathy.  ?Neurological:  ?   Mental Status: She is alert.  ? ? ? ?LABORATORY DATA:  ?I have reviewed the data as listed ?Lab Results  ?Component Value Date  ? WBC 7.0 06/23/2021  ? HGB 12.3 06/23/2021  ? HCT 39.5 06/23/2021  ? MCV 77.3 (L) 06/23/2021  ? PLT 361 06/23/2021  ? ?Lab Results  ?Component Value Date  ? NA 140 06/23/2021  ? K 3.3 (L) 06/23/2021  ? CL 103 06/23/2021  ? CO2 27 06/23/2021  ? ? ?RADIOGRAPHIC STUDIES: ?I have personally reviewed the radiological reports and agreed with the findings in the report. ? ?ASSESSMENT AND PLAN:  ?Malignant neoplasm of upper-outer quadrant of right breast in female, estrogen receptor positive ([place]) ?This is a very pleasant 66 year old postmenopausal female patient with newly diagnosed right breast invasive ductal carcinoma, grade 1 ER/PR positive, KI of 2%, HER2 FISH pending  referred to breast [facility] for recommendations.  Given small tumor, architectural distortion measuring about 1.7 cm, strong ER/PR positivity, HER2 FISH pending, it is reasonable to proceed with upfront surgery provided HER2 remains negative. ? ?Postsurgery, we can consider Oncotype testing and we have discussed the following details about Oncotype. We have discussed about Oncotype Dx score which is a well validated prognostic scoring system which can predict outcome with endocrine therapy alone and whether chemotherapy reduces recurrence.  Typically in patients with ER positive cancers that are node negative if the RS score is high typically greater than or equal to 26, chemotherapy is recommended.  ?In women with intermediate recurrence score younger than 66, there can still be some role for chemotherapy in addition to endocrine therapy especially if the recurrence score is between 21-25. ?  Given her pathology, it is very less likely that she will need any adjuvant chemotherapy but she is interested in Oncotype testing to know more about prognostic information. ? ?We have discussed about considering antiestrogen therapy after completion of adjuvant radiation.  We have discussed the following details about  antiestrogen therapy. ? ?We have discussed options for antiestrogen therapy today ? ?With regards to Tamoxifen, we discussed that this is a SERM, selective estrogen receptor modulator. We discussed mechanism of action of Tamoxifen, adverse effects on Tamoxifen including but not limited to post menopausal symptoms, increased risk of DVT/PE, increased risk of endometrial cancer, questionable cataracts with long term use and increased risk of cardiovascular events in the study which was not statistically significant. A benefit from Tamoxifen would be improvement in bone density. ? ?With regards to aromatase inhibitors, we discussed mechanism of action, adverse effects including but not limited to post menopausal  symptoms, arthralgias, myalgias, increased risk of cardiovascular events and bone loss.  ? ?She will return to clinic after surgery to discuss pathology results as well as Oncotype results. ? ?She does not have any pre

## 2021-06-23 NOTE — Research (Signed)
Trial:  [employer] 2021-05 - Specimen Collection Study to Evaluate Biomarkers in Subjects with Cancer   ?Patient Alexandria Hoover was identified by Dr. Iruku as a potential candidate for the above listed study.  This Clinical Research Nurse met with [name], [id number], on 06/23/21 in a manner and location that ensures patient privacy to discuss participation in the above listed research study.  Patient is Unaccompanied.  A copy of the informed consent document with embedded HIPAA language was provided to the patient.  Patient reads, speaks, and understands English.   ?Patient was provided with the business card of this Nurse and encouraged to contact the research team with any questions.  Approximately 5 minutes were spent with the patient reviewing the informed consent documents.  Patient was provided the option of taking informed consent documents home to review and was encouraged to review at their convenience with their support network, including other care providers. Patient took the consent documents home to review. ?Cameo Windham, BSN, RN, CCRP ?Clinical Research Nurse II ?06/23/2021 11:10 AM ?  ?

## 2021-06-23 NOTE — Progress Notes (Signed)
[facility] Clinical Social Work  ?Initial Assessment ? ? ?Alexandria Hoover is a 66 y.o. year old female presenting alone. Clinical Social Work was referred by  [facility]  for assessment of psychosocial needs.  ? ?SDOH (Social Determinants of Health) assessments performed: Yes ?SDOH Interventions   ? ?Flowsheet Row Most Recent Value  ?SDOH Interventions   ?Food Insecurity Interventions Intervention Not Indicated  ?Financial Strain Interventions Other (Comment)  [cancer foundations]  ?Housing Interventions Intervention Not Indicated  ?Transportation Interventions Intervention Not Indicated, Patient Resources (Friends/Family)  ? ?  ?  ?Distress Screen completed: Yes ? ?  06/23/2021  ? 12:08 PM  ?ONCBCN DISTRESS SCREENING  ?Screening Type Initial Screening  ?Distress experienced in past week (1-10) 0  ?Practical problem type Insurance;Work/school;Food  ?Emotional problem type Adjusting to illness  ?Information Concerns Type Lack of info about diagnosis;Lack of info about treatment  ?Physical Problem type Pain;Nausea/vomiting;Getting around;Bathing/dressing;Breathing;Mouth sores/swallowing;Loss of appetitie;Talking;Constipation/diarrhea;Swollen arms/legs  ? ? ? ? ?Family/Social Information:  ?Housing Arrangement: patient lives with her sister ?Family members/support persons in your life? Family, Friends, and pastor ?Transportation concerns: no, has family who can help if needed  ?Employment: Working full time as a Radiation protection practitioner. Income source: Employment. Not sure if she has benefits ?Financial concerns:  Potentially depending on medical costs ?Type of concern: Medical bills ?Food access concerns: no ?Religious or spiritual practice: yes, has support from her pastor ?Services Currently in place:  n/a ? ?Coping/ Adjustment to diagnosis: ?Patient understands treatment plan and what happens next? yes ?Concerns about diagnosis and/or treatment: How I will pay for the services I need ?Patient reported stressors: Insurance, Work/ school, and  Adjusting to my illness ?Patient enjoys time with family/ friends ?Current coping skills/ strengths: Capable of independent living  and Motivation for treatment/growth  ? ? ? SUMMARY: ?Current SDOH Barriers:  ?Potential concerns with medical costs ? ?Clinical Social Work Clinical Goal(s):  ?Patient will complete applications for assistance and submit with assistance from [facility] ? ?Interventions: ?Discussed common feeling and emotions when being diagnosed with cancer, and the importance of support during treatment ?Informed patient of the support team roles and support services at [facility] ?Provided CSW contact information and encouraged patient to call with any questions or concerns ?Provided patient with information about breast cancer foundations for potential financial assistance ? ? ?Follow Up Plan: Patient will work on applications and contact CSW with any questions or for help submitting ?Patient verbalizes understanding of plan: Yes ? ? ? ?Cassondra Stachowski E Ozetta Flatley, LCSW ?

## 2021-06-23 NOTE — Therapy (Signed)
?OUTPATIENT PHYSICAL THERAPY BREAST CANCER BASELINE EVALUATION ? ? ?Patient Name: Alexandria Hoover ?MRN: [id number] ?DOB:[birth year], 66 y.o., female ?Today's Date: 06/23/2021 ? ? PT End of Session - 06/23/21 1030   ? ? Visit Number 1   ? Number of Visits 2   ? Date for PT Re-Evaluation 08/18/21   ? PT Start Time [phone]   ? PT Stop Time 1016   ? PT Time Calculation (min) 23 min   ? Activity Tolerance Patient tolerated treatment well   ? Behavior During Therapy [facility] for tasks assessed/performed   ? ?  ?  ? ?  ? ? ?Past Medical History:  ?Diagnosis Date  ? Diabetes ([place])   ? Hypertension   ? ?Past Surgical History:  ?Procedure Laterality Date  ? ABDOMINAL HYSTERECTOMY    ? BREAST EXCISIONAL BIOPSY Right over 10 yrs  ? ?Patient Active Problem List  ? Diagnosis Date Noted  ? Malignant neoplasm of upper-outer quadrant of right breast in female, estrogen receptor positive ([place]) 06/21/2021  ? ? ?PCP: Dr. Tommi Rumps ? ?REFERRING PROVIDER: Dr. Rolm Bookbinder ? ?REFERRING DIAG: Right breast cancer ? ?THERAPY DIAG:  ?Malignant neoplasm of upper-outer quadrant of right breast in female, estrogen receptor positive ([place]) ? ?Abnormal posture ? ?Stiffness of left shoulder, not elsewhere classified ? ?Stiffness of right shoulder, not elsewhere classified ? ?ONSET DATE: 04/12/2021 ? ?SUBJECTIVE                                                                                                                                                                                          ? ?SUBJECTIVE STATEMENT: ?Patient reports she is here today to be seen by her medical team for her newly diagnosed right breast cancer.  ? 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 It measures 1.7 cm and is located in the upper outer quadrant. It is ER/PR positive and HER2 negative with a Ki67 of 2%. Also has hypertension and diabetes. ? ?PATIENT GOALS   reduce lymphedema risk and learn post op  HEP.  ? ?PAIN:  ?Are you having pain? No ? ? ?PRECAUTIONS: Active CA None ? ?HAND DOMINANCE: right ? ?WEIGHT BEARING RESTRICTIONS No ? ?FALLS:  ?Has patient fallen in last 6 months? No ? ?LIVING ENVIRONMENT: ?Patient lives with: her sister ?Lives in: House/apartment ?Has following equipment at home: None ? ?OCCUPATION: full time packer ? ?LEISURE: She does not exercise ? ?PRIOR LEVEL OF FUNCTION: Independent ? ? ?OBJECTIVE ? ?COGNITION: ? Overall cognitive status: Within functional limits for tasks assessed   ? ?POSTURE:  ?Forward head and rounded shoulders  posture ? ?UPPER EXTREMITY AROM/PROM: ? ?A/PROM RIGHT  06/23/2021 ?  ?Shoulder extension 48  ?Shoulder flexion 145  ?Shoulder abduction 125  ?Shoulder internal rotation 82  ?Shoulder external rotation 48  ?  (Blank rows = not tested) ? ?A/PROM LEFT  06/23/2021  ?Shoulder extension 53  ?Shoulder flexion 125  ?Shoulder abduction 126  ?Shoulder internal rotation 70  ?Shoulder external rotation 56  ?  (Blank rows = not tested) ? ? ?CERVICAL AROM: ?All within normal limits:  ? ? Percent limited  ?Flexion WNL  ?Extension 75% limited  ?Right lateral flexion 25% limited  ?Left lateral flexion 50% limited  ?Right rotation 25% limited  ?Left rotation 25% limited  ? ? ? ?UPPER EXTREMITY STRENGTH: WNL ? ? ?LYMPHEDEMA ASSESSMENTS:  ? ?[place] RIGHT  06/23/2021  ?10 cm proximal to olecranon process 35.3  ?Olecranon process 30.1  ?10 cm proximal to ulnar styloid process 26  ?Just proximal to ulnar styloid process 18.7  ?Across hand at thumb web space 20.4  ?At base of 2nd digit 6.8  ?(Blank rows = not tested) ? ?[place] LEFT  06/23/2021  ?10 cm proximal to olecranon process 36.8  ?Olecranon process 30.7  ?10 cm proximal to ulnar styloid process 26.2  ?Just proximal to ulnar styloid process 20.5  ?Across hand at thumb web space 20.4  ?At base of 2nd digit 6.8  ?(Blank rows = not tested) ? ? ?L-DEX LYMPHEDEMA SCREENING: ? ?The patient was assessed using the L-Dex machine today to  produce a lymphedema index baseline score. The patient will be reassessed on a regular basis (typically every 3 months) to obtain new L-Dex scores. If the score is > 6.5 points away from his/her baseline score indicating onset of subclinical lymphedema, it will be recommended to wear a compression garment for 4 weeks, 12 hours per day and then be reassessed. If the score continues to be > 6.5 points from baseline at reassessment, we will initiate lymphedema treatment. Assessing in this manner has a 95% rate of preventing clinically significant lymphedema. ? ? L-DEX FLOWSHEETS - 06/23/21 1000   ? ?  ? L-DEX LYMPHEDEMA SCREENING  ? Measurement Type Unilateral   ? L-DEX MEASUREMENT EXTREMITY Upper Extremity   ? POSITION  Standing   ? DOMINANT SIDE Right   ? At Risk Side Right   ? BASELINE SCORE (UNILATERAL) 0   ? ?  ?  ? ?  ? ? ? ?QUICK DASH SURVEY: ? Katina Dung - 06/23/21 0001   ? ? Open a tight or new jar Mild difficulty   ? Do heavy household chores (wash walls, wash floors) No difficulty   ? Carry a shopping bag or briefcase No difficulty   ? Wash your back No difficulty   ? Use a knife to cut food No difficulty   ? Recreational activities in which you take some force or impact through your arm, shoulder, or hand (golf, hammering, tennis) No difficulty   ? During the past week, to what extent has your arm, shoulder or hand problem interfered with your normal social activities with family, friends, neighbors, or groups? Not at all   ? During the past week, to what extent has your arm, shoulder or hand problem limited your work or other regular daily activities Not at all   ? Arm, shoulder, or hand pain. None   ? Tingling (pins and needles) in your arm, shoulder, or hand None   ? Difficulty Sleeping No difficulty   ? DASH  Score 2.27 %   ? ?  ?  ? ?  ? ? ? ?PATIENT EDUCATION:  ?Education details: Lymphedema risk reduction and post op shoulder/posture HEP ?Person educated: Patient ?Education method: Explanation,  Demonstration, Handout ?Education comprehension: Patient verbalized understanding and returned demonstration ? ? ?HOME EXERCISE PROGRAM: ?Patient was instructed today in a home exercise program today for post op shoulder range of motion. These included active assist shoulder flexion in sitting, scapular retraction, wall walking with shoulder abduction, and hands behind head external rotation.  She was encouraged to do these twice a day, holding 3 seconds and repeating 5 times when permitted by her physician. ? ? ?ASSESSMENT: ? 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 It measures 1.7 cm and is located in the upper outer quadrant. It is ER/PR positive and HER2 negative with a Ki67 of 2%. Also has hypertension and diabetes.Her multidisciplinary medical team met prior to her assessments to determine a recommended treatment plan. She is planning to have a right lumpectomy and sentinel node biopsy followed by radiation and anti-estrogen therapy. She will benefit from a post op PT reassessment to determine needs and from L-Dex screens every 3 months for 2 years to detect subclinical lymphedema. ? ?Pt will benefit from skilled therapeutic intervention to improve on the following deficits: Decreased knowledge of precautions, impaired UE functional use, pain, decreased ROM, postural dysfunction.  ? ?PT treatment/interventions: ADL/self-care home management, pt/family education, therapeutic exercise ? ?REHAB POTENTIAL: Excellent ? ?CLINICAL DECISION MAKING: Stable/uncomplicated ? ?EVALUATION COMPLEXITY: Low ? ? ?GOALS: ?Goals reviewed with patient? YES ? ?LONG TERM GOALS: (STG=LTG) ? ? Name Target Date Goal status  ?1 Pt will be able to verbalize understanding of pertinent lymphedema risk reduction practices relevant to her dx specifically related to skin care.  ?Baseline:  No knowledge 06/23/2021 Achieved at eval  ?2 Pt will be able to return demo  and/or verbalize understanding of the post op HEP related to regaining shoulder ROM. ?Baseline:  No knowledge 06/23/2021 Achieved at eval  ?3 Pt will be able to verbalize understanding of the importance of attend

## 2021-06-24 ENCOUNTER — Encounter: Payer: Self-pay | Admitting: Genetic Counselor

## 2021-06-24 DIAGNOSIS — Z803 Family history of malignant neoplasm of breast: Secondary | ICD-10-CM | POA: Insufficient documentation

## 2021-06-24 DIAGNOSIS — Z8042 Family history of malignant neoplasm of prostate: Secondary | ICD-10-CM | POA: Insufficient documentation

## 2021-06-24 NOTE — Progress Notes (Signed)
REFERRING PROVIDER: ?Benay Pike, MD ?[address] ?[place], [place] [address] ? ?PRIMARY PROVIDER:  ?Maurice Small, MD ? ?PRIMARY REASON FOR VISIT:  ?1. Malignant neoplasm of upper-outer quadrant of right breast in female, estrogen receptor positive ([place])   ?2. Family history of breast cancer   ?3. Family history of prostate cancer   ? ?HISTORY OF PRESENT ILLNESS:   ?Alexandria Hoover, a 66 y.o. female, was seen for a [place] cancer genetics consultation during the breast multidisciplinary clinic at the request of Dr. Chryl Heck due to a personal and family history of cancer.  [name] presents to clinic today to discuss the possibility of a hereditary predisposition to cancer, to discuss genetic testing, and to further clarify her future cancer risks, as well as potential cancer risks for family members.  ? ?In April 2023, at the age of 78, [name] was diagnosed with invasive ductal carcinoma of the right breast.  ? ?CANCER HISTORY:  ?Oncology History  ?Malignant neoplasm of upper-outer quadrant of right breast in female, estrogen receptor positive ([place])  ?04/12/2021 Mammogram  ? Mammogram from April 12, 2021 showed possible distortion in the right breast and additional imaging was recommended. ?Diagnostic mammogram confirmed indeterminate architectural distortion involving the upper outer quadrant of the right breast at middle depth adjacent to a group of benign calcifications.  Stereotactic tomosynthesis core needle biopsy of the right breast was recommended ?  ?06/11/2021 Pathology Results  ? Pathology from the breast biopsy showed invasive mammary carcinoma ductal subtype, grade 1.  Prognostic showed ER 100% positive strong staining PR 100% positive strong staining, KI of 2% and HER2 by FISH pending ?  ?06/21/2021 Initial Diagnosis  ? Malignant neoplasm of upper-outer quadrant of right breast in female, estrogen receptor positive ([place]) ?  ?06/23/2021 Cancer Staging  ? Staging form: Breast, AJCC 8th  Edition ?- Clinical stage from 06/23/2021: Stage IA (cT1c, cN0, cM0, G1, ER+, PR+, HER2-) - Signed by Benay Pike, MD on 06/23/2021 ?Stage prefix: Initial diagnosis ?Histologic grading system: 3 grade system ?Laterality: Right ?Staged by: Pathologist and managing physician ?Stage used in treatment planning: Yes ?National guidelines used in treatment planning: Yes ?Type of national guideline used in treatment planning: NCCN ? ?  ? ? ? ?RISK FACTORS:  ?OCP use for approximately 0 years.  ?Uterus intact: no.  ?Menopausal status: postmenopausal.  ?HRT use: 0 years. ?Colonoscopy: yes ?Mammogram within the last year: yes. ?Any excessive radiation exposure in the past: no ? ?Past Medical History:  ?Diagnosis Date  ? Diabetes ([place])   ? Hypertension   ? ? ?Past Surgical History:  ?Procedure Laterality Date  ? ABDOMINAL HYSTERECTOMY    ? BREAST EXCISIONAL BIOPSY Right over 10 yrs  ? ? ?Social History  ? ?Socioeconomic History  ? Marital status: Divorced  ?  Spouse name: Not on file  ? Number of children: Not on file  ? Years of education: Not on file  ? Highest education level: Not on file  ?Occupational History  ? Not on file  ?Tobacco Use  ? Smoking status: Never  ? Smokeless tobacco: Not on file  ?Substance and Sexual Activity  ? Alcohol use: Never  ? Drug use: Never  ? Sexual activity: Not on file  ?Other Topics Concern  ? Not on file  ?Social History Narrative  ? Not on file  ? ?Social Determinants of Health  ? ?Financial Resource Strain: Medium Risk  ? Difficulty of Paying Living Expenses: Somewhat hard  ?Food Insecurity: No Food  Insecurity  ? Worried About Charity fundraiser in the Last Year: Never true  ? Ran Out of Food in the Last Year: Never true  ?Transportation Needs: No Transportation Needs  ? Lack of Transportation (Medical): No  ? Lack of Transportation (Non-Medical): No  ?Physical Activity: Not on file  ?Stress: Not on file  ?Social Connections: Not on file  ?  ? ?FAMILY HISTORY:  ?We obtained a detailed,  4-generation family history.  Significant diagnoses are listed below: ?Family History  ?Problem Relation Age of Onset  ? Throat cancer Father   ? Breast cancer Sister 38  ? Pancreatic cancer Brother   ?     maternal half-brother  ? Throat cancer Brother   ?     maternal half-brother  ? Breast cancer Paternal Aunt   ? Prostate cancer Paternal Uncle   ? Prostate cancer Cousin   ? ? ? ?Ms. Biddinger's sister was diagnosed with breast cancer at age 42. Her maternal half-brother was diagnosed with pancreatic cancer and a second maternal half-brother was diagnosed with throat cancer. Ms. Procida father was diagnosed with throat cancer, he smoked and died at age 5. Her paternal aunt was diagnosed with breast cancer, she is deceased. Her paternal uncle was diagnosed with prostate cancer, he is deceased. She has one paternal cousin who was diagnosed with prostate cancer. Ms. Ke is unaware of previous family history of genetic testing for hereditary cancer risks. There is no reported Ashkenazi Jewish ancestry.  ? ?GENETIC COUNSELING ASSESSMENT: Ms. Leeb is a 66 y.o. female with a personal and family history of cancer which is somewhat suggestive of a hereditary cancer syndrome and predisposition to cancer. We, therefore, discussed and recommended the following at today's visit.  ? ?DISCUSSION: We discussed that 5 - 10% of cancer is hereditary, with most cases of hereditary breast cancer associated with mutations in BRCA1/2.  There are other genes that can be associated with hereditary breast and prostate cancer syndromes. Type of cancer risk and level of risk are gene-specific. We discussed that testing is beneficial for several reasons including knowing how to follow individuals after completing their treatment, identifying whether potential treatment options would be beneficial, and understanding if other family members could be at risk for cancer and allowing them to undergo genetic testing.  ? ?We reviewed the  characteristics, features and inheritance patterns of hereditary cancer syndromes. We also discussed genetic testing, including the appropriate family members to test, the process of testing, insurance coverage and turn-around-time for results. We discussed the implications of a negative, positive and/or variant of uncertain significant result. In order to get genetic test results in a timely manner so that Ms. Bruney can use these genetic test results for surgical decisions, we recommended Ms. Vonruden pursue genetic testing for the BRCAplus. Once complete, we recommend Ms. Mciver pursue reflex genetic testing to a more comprehensive gene panel.  ? ?Ms. Uzelac elected to have [facility] CustomNext Panel. The CustomNext-Cancer+RNAinsight panel offered by [facility] includes sequencing and rearrangement analysis for the following 47 genes:  APC, ATM, AXIN2, BARD1, BMPR1A, BRCA1, BRCA2, BRIP1, CDH1, CDK4, CDKN2A, CHEK2, CTNNA1, DICER1, EPCAM, GREM1, HOXB13, KIT, MEN1, MLH1, MSH2, MSH3, MSH6, MUTYH, NBN, NF1, NTHL1, PALB2, PDGFRA, PMS2, POLD1, POLE, PTEN, RAD50, RAD51C, RAD51D, SDHA, SDHB, SDHC, SDHD, SMAD4, SMARCA4, STK11, TP53, TSC1, TSC2, and VHL.  RNA data is routinely analyzed for use in variant interpretation for all genes. ? ?Based on Ms. Schulenburg's personal and family history of cancer, she meets  medical criteria for genetic testing. Despite that she meets criteria, she may still have an out of pocket cost. We discussed that if her out of pocket cost for testing is over $100, the laboratory should contact them to discuss self-pay prices, patient pay assistance programs, if applicable, and other billing options.  ? ?PLAN: After considering the risks, benefits, and limitations, Ms. Scarfo provided informed consent to pursue genetic testing and the blood sample was sent to [facility] for analysis of the CustomNext Panel. Results should be available within approximately 1-2 weeks' time, at which point they  will be disclosed by telephone to Ms. Baldini, as will any additional recommendations warranted by these results. Ms. Robak will receive a summary of her genetic counseling visit and a copy of her result

## 2021-06-25 ENCOUNTER — Other Ambulatory Visit: Payer: Self-pay | Admitting: General Surgery

## 2021-06-25 ENCOUNTER — Telehealth: Payer: Self-pay | Admitting: Hematology and Oncology

## 2021-06-25 DIAGNOSIS — R928 Other abnormal and inconclusive findings on diagnostic imaging of breast: Secondary | ICD-10-CM

## 2021-06-25 NOTE — Telephone Encounter (Signed)
Scheduled appointment per 05/10 los. Patient aware.  ?

## 2021-06-28 ENCOUNTER — Telehealth: Payer: Self-pay | Admitting: Hematology and Oncology

## 2021-06-28 NOTE — Telephone Encounter (Signed)
Scheduled per 05/12 scheduled message, patient has been called and voicemail was left. ?

## 2021-06-29 ENCOUNTER — Telehealth: Payer: Self-pay | Admitting: *Deleted

## 2021-06-29 NOTE — Telephone Encounter (Signed)
Received VM from patient with questions. Returned patient's phone call but had to leave VM. ?

## 2021-06-29 NOTE — Telephone Encounter (Signed)
[id number]: LVM for patient to follow up on the study introduced to patient last week. Requested she call research nurse back at her convenience.  ?Alexandria Hoover, BSN, RN, CCRP ?Clinical Research Nurse II ?06/29/2021 10:50 AM ? ? ?

## 2021-06-30 ENCOUNTER — Encounter: Payer: Self-pay | Admitting: Genetic Counselor

## 2021-06-30 ENCOUNTER — Telehealth: Payer: Self-pay | Admitting: Genetic Counselor

## 2021-06-30 DIAGNOSIS — Z1379 Encounter for other screening for genetic and chromosomal anomalies: Secondary | ICD-10-CM | POA: Insufficient documentation

## 2021-06-30 NOTE — Telephone Encounter (Signed)
I attempted to contact [name] to discuss her genetic testing results (8 genes). I left a voicemail requesting she call me back at [phone]. ? ?Alexandria Passy, MS, LCGC ?Genetic Counselor ?[email or web address] ?(P) [phone] ? ?

## 2021-07-01 ENCOUNTER — Telehealth: Payer: Self-pay | Admitting: *Deleted

## 2021-07-01 ENCOUNTER — Encounter: Payer: Self-pay | Admitting: *Deleted

## 2021-07-01 ENCOUNTER — Telehealth: Payer: Self-pay | Admitting: Genetic Counselor

## 2021-07-01 NOTE — Telephone Encounter (Signed)
Spoke with patient to follow up from [facility] 5/10 and assess navigation needs.  Patient denies any questions or concerns at this time other than wanting to know if she would have to pay the anesthesiologist upfront prior to her sx. Informed her that from my experience that she will be billed after surgery from anesthesiology. Usually the only payment upfront would be to her surgeon's office and to the hospital.  Patient verbalized understanding. Encouraged her to call should any other questions arise.

## 2021-07-01 NOTE — Telephone Encounter (Signed)
[id number]: LVM for patient to follow up on the study introduced to patient last week. Requested she call research nurse back and gave my phone number.  Foye Spurling, BSN, RN, [place] Nurse II 07/01/2021 1:26 PM

## 2021-07-01 NOTE — Telephone Encounter (Signed)
I attempted to contact Alexandria Hoover to discuss her genetic testing results (8 genes). I left a voicemail requesting she call me back at [phone].  Lucille Passy, MS, [facility] Genetic Counselor [email or web address] (P) [phone]

## 2021-07-01 NOTE — Progress Notes (Signed)
Surgical Instructions    Your procedure is scheduled on 07/06/2021.  Report to [facility] Main Entrance "A" at 11 A.M., then check in with the Admitting office.  Call this number if you have problems the morning of surgery:  [phone]   If you have any questions prior to your surgery date call [phone]: Open Monday-Friday 8am-4pm    Remember:  Do not eat after midnight the night before your surgery  You may drink clear liquids until 10am the morning of your surgery.   Clear liquids allowed are: Water, Non-Citrus Juices (without pulp), Carbonated Beverages, Clear Tea, Black Coffee ONLY (NO MILK, CREAM OR POWDERED CREAMER of any kind), and Gatorade   The day of surgery (if you have diabetes): Drink ONE (1) 12 oz G2 given to you in your pre admission testing appointment by 10 am the morning of surgery. Drink in one sitting. Do not sip.  This drink was given to you during your hospital  pre-op appointment visit.  Nothing else to drink after completing the  12 oz bottle of G2.        If you have questions, please contact your surgeon's office.     Take these medicines the morning of surgery with A SIP OF WATER:  amLODipine (NORVASC) loratadine (CLARITIN)if needed pravastatin (PRAVACHOL)   As of today, STOP taking any Aspirin (unless otherwise instructed by your surgeon) Aleve, Naproxen, Ibuprofen, Motrin, Advil, Goody's, BC's, all herbal medications, fish oil, and all vitamins.   WHAT DO I DO ABOUT MY DIABETES MEDICATION?   Do not take oral diabetes medicines (pills) the morning of surgery. DO NOT TAKE metFORMIN (GLUCOPHAGE-XR) THE MORNING OF SURGERY.    HOW TO MANAGE YOUR DIABETES BEFORE AND AFTER SURGERY  Why is it important to control my blood sugar before and after surgery? Improving blood sugar levels before and after surgery helps healing and can limit problems. A way of improving blood sugar control is eating a healthy diet by:  Eating less sugar and  carbohydrates  Increasing activity/exercise  Talking with your doctor about reaching your blood sugar goals High blood sugars (greater than 180 mg/dL) can raise your risk of infections and slow your recovery, so you will need to focus on controlling your diabetes during the weeks before surgery. Make sure that the doctor who takes care of your diabetes knows about your planned surgery including the date and location.  How do I manage my blood sugar before surgery? Check your blood sugar at least 4 times a day, starting 2 days before surgery, to make sure that the level is not too high or low.  Check your blood sugar the morning of your surgery when you wake up and every 2 hours until you get to the Short Stay unit.  If your blood sugar is less than 70 mg/dL, you will need to treat for low blood sugar: Do not take insulin. Treat a low blood sugar (less than 70 mg/dL) with  cup of clear juice (cranberry or apple), 4 glucose tablets, OR glucose gel. Recheck blood sugar in 15 minutes after treatment (to make sure it is greater than 70 mg/dL). If your blood sugar is not greater than 70 mg/dL on recheck, call [phone] for further instructions. Report your blood sugar to the short stay nurse when you get to Short Stay.  If you are admitted to the hospital after surgery: Your blood sugar will be checked by the staff and you will probably be given insulin after surgery (  instead of oral diabetes medicines) to make sure you have good blood sugar levels. The goal for blood sugar control after surgery is 80-180 mg/dL.         Do not wear jewelry or makeup Do not wear lotions, powders, perfumes/colognes, or deodorant. Do not shave 48 hours prior to surgery.  Men may shave face and neck. Do not bring valuables to the hospital. Do not wear nail polish, gel polish, artificial nails, or any other type of covering on natural nails (fingers and toes) If you have artificial nails or gel coating that need to  be removed by a nail salon, please have this removed prior to surgery. Artificial nails or gel coating may interfere with anesthesia's ability to adequately monitor your vital signs.  [facility] is not responsible for any belongings or valuables. .   Do NOT Smoke (Tobacco/Vaping)  24 hours prior to your procedure  If you use a CPAP at night, you may bring your mask for your overnight stay.   Contacts, glasses, hearing aids, dentures or partials may not be worn into surgery, please bring cases for these belongings   For patients admitted to the hospital, discharge time will be determined by your treatment team.   Patients discharged the day of surgery will not be allowed to drive home, and someone needs to stay with them for 24 hours.   SURGICAL WAITING ROOM VISITATION Patients having surgery or a procedure in a hospital may have two support people. Children under the age of 50 must have an adult with them who is not the patient. They may stay in the waiting area during the procedure and may switch out with other visitors. If the patient needs to stay at the hospital during part of their recovery, the visitor guidelines for inpatient rooms apply.  Please refer to the [facility] website for the visitor guidelines for Inpatients (after your surgery is over and you are in a regular room).       Special instructions:    Oral Hygiene is also important to reduce your risk of infection.  Remember - BRUSH YOUR TEETH THE MORNING OF SURGERY WITH YOUR REGULAR TOOTHPASTE   [facility]- Preparing For Surgery  Before surgery, you can play an important role. Because skin is not sterile, your skin needs to be as free of germs as possible. You can reduce the number of germs on your skin by washing with CHG (chlorahexidine gluconate) Soap before surgery.  CHG is an antiseptic cleaner which kills germs and bonds with the skin to continue killing germs even after washing.     Please do not use if you  have an allergy to CHG or antibacterial soaps. If your skin becomes reddened/irritated stop using the CHG.  Do not shave (including legs and underarms) for at least 48 hours prior to first CHG shower. It is OK to shave your face.  Please follow these instructions carefully.     Shower the NIGHT BEFORE SURGERY and the MORNING OF SURGERY with CHG Soap.   If you chose to wash your hair, wash your hair first as usual with your normal shampoo. After you shampoo, rinse your hair and body thoroughly to remove the shampoo.  Then [employer] and genitals (private parts) with your normal soap and rinse thoroughly to remove soap.  After that Use CHG Soap as you would any other liquid soap. You can apply CHG directly to the skin and wash gently with a scrungie or a clean  washcloth.   Apply the CHG Soap to your body ONLY FROM THE NECK DOWN.  Do not use on open wounds or open sores. Avoid contact with your eyes, ears, mouth and genitals (private parts). Wash Face and genitals (private parts)  with your normal soap.   Wash thoroughly, paying special attention to the area where your surgery will be performed.  Thoroughly rinse your body with warm water from the neck down.  DO NOT shower/wash with your normal soap after using and rinsing off the CHG Soap.  Pat yourself dry with a CLEAN TOWEL.  Wear CLEAN PAJAMAS to bed the night before surgery  Place CLEAN SHEETS on your bed the night before your surgery  DO NOT SLEEP WITH PETS.   Day of Surgery:  Take a shower with CHG soap. Wear Clean/Comfortable clothing the morning of surgery Do not apply any deodorants/lotions.   Remember to brush your teeth WITH YOUR REGULAR TOOTHPASTE.    If you received a COVID test during your pre-op visit, it is requested that you wear a mask when out in public, stay away from anyone that may not be feeling well, and notify your surgeon if you develop symptoms. If you have been in contact with anyone that has tested  positive in the last 10 days, please notify your surgeon.    Please read over the following fact sheets that you were given.

## 2021-07-02 ENCOUNTER — Telehealth: Payer: Self-pay | Admitting: Genetic Counselor

## 2021-07-02 ENCOUNTER — Other Ambulatory Visit: Payer: Self-pay

## 2021-07-02 ENCOUNTER — Encounter (HOSPITAL_COMMUNITY): Payer: Self-pay

## 2021-07-02 ENCOUNTER — Encounter (HOSPITAL_COMMUNITY)
Admission: RE | Admit: 2021-07-02 | Discharge: 2021-07-02 | Disposition: A | Discharge status: Home / Self Care | Payer: Medicare Other | Source: Ambulatory Visit | Attending: General Surgery | Admitting: General Surgery

## 2021-07-02 ENCOUNTER — Telehealth: Payer: Self-pay | Admitting: *Deleted

## 2021-07-02 VITALS — BP 147/69 | HR 79 | Temp 98.2°F | Resp 18 | Ht 64.0 in | Wt 196.5 lb

## 2021-07-02 DIAGNOSIS — R011 Cardiac murmur, unspecified: Secondary | ICD-10-CM | POA: Insufficient documentation

## 2021-07-02 DIAGNOSIS — Z01818 Encounter for other preprocedural examination: Secondary | ICD-10-CM | POA: Diagnosis present

## 2021-07-02 DIAGNOSIS — I10 Essential (primary) hypertension: Secondary | ICD-10-CM | POA: Insufficient documentation

## 2021-07-02 DIAGNOSIS — E119 Type 2 diabetes mellitus without complications: Secondary | ICD-10-CM | POA: Insufficient documentation

## 2021-07-02 HISTORY — DX: Cardiac murmur, unspecified: R01.1

## 2021-07-02 HISTORY — DX: Pure hypercholesterolemia, unspecified: E78.00

## 2021-07-02 LAB — HEMOGLOBIN A1C
Hgb A1c MFr Bld: 6.5 % — ABNORMAL HIGH (ref 4.8–5.6)
Mean Plasma Glucose: 139.85 mg/dL

## 2021-07-02 LAB — BASIC METABOLIC PANEL
Anion gap: 10 (ref 5–15)
BUN: 22 mg/dL (ref 8–23)
CO2: 27 mmol/L (ref 22–32)
Calcium: 10.3 mg/dL (ref 8.9–10.3)
Chloride: 103 mmol/L (ref 98–111)
Creatinine, Ser: 1 mg/dL (ref 0.44–1.00)
GFR, Estimated: >60 mL/min (ref >60)
Glucose, Bld: 110 mg/dL — ABNORMAL HIGH (ref 70–99)
Potassium: 3.3 mmol/L — ABNORMAL LOW (ref 3.5–5.1)
Sodium: 140 mmol/L (ref 135–145)

## 2021-07-02 LAB — GLUCOSE, CAPILLARY: Glucose-Capillary: 142 mg/dL — ABNORMAL HIGH (ref 70–99)

## 2021-07-02 NOTE — Telephone Encounter (Signed)
I contacted [name] to discuss her genetic testing results. No pathogenic variants were identified in the 8 genes analyzed. Of note, we are still waiting on the pan-cancer panel. Detailed clinic note to follow.  The test report has been scanned into EPIC and is located under the Molecular Pathology section of the Results Review tab.  A portion of the result report is included below for reference.   Alexandria Passy, MS, [facility] Genetic Counselor [email or web address] (P) [phone]

## 2021-07-02 NOTE — Telephone Encounter (Signed)
[id number]; LVM to follow up on the study and see if patient has any questions and if she is interested in participating. Left my phone number on her cell and home number to call back. Foye Spurling, BSN, RN, CCRP Clinical Research Nurse II 07/02/2021 10:38 AM

## 2021-07-02 NOTE — Progress Notes (Signed)
PCP - Dr. Angelina Pih Cardiologist - denies  PPM/ICD - denies   Chest x-ray - 08/06/10 EKG - 07/02/21 Stress Test - denies ECHO - denies Cardiac Cath - denies  Sleep Study - denies  DM Fasting Blood Sugar - pt unsure Pt states she just "spot checks" CBG occasionally  ASA/Blood Thinner Instructions: n/a   ERAS Protcol - Yes PRE-SURGERY G2- given at PAT  COVID TEST- n/a   Anesthesia review: yes, Jeneen Rinks is aware of pt and listened to her heart and lungs at PAT visit (hx of heart murmur)  Patient denies shortness of breath, fever, cough and chest pain at PAT appointment   All instructions explained to the patient, with a verbal understanding of the material. Patient agrees to go over the instructions while at home for a better understanding. Patient also instructed to notify surgeon of any contact with COVID+ person or if she develops any symptoms. The opportunity to ask questions was provided.

## 2021-07-05 ENCOUNTER — Ambulatory Visit
Admission: RE | Admit: 2021-07-05 | Discharge: 2021-07-05 | Disposition: A | Discharge status: Home / Self Care | Payer: Commercial Managed Care - HMO | Source: Ambulatory Visit | Attending: General Surgery | Admitting: General Surgery

## 2021-07-05 DIAGNOSIS — Z170 Estrogen receptor positive status [ER+]: Secondary | ICD-10-CM

## 2021-07-05 NOTE — Anesthesia Preprocedure Evaluation (Signed)
Anesthesia Evaluation  Patient identified by MRN, date of birth, ID band Patient awake    Reviewed: Allergy & Precautions, NPO status , Patient's Chart, lab work & pertinent test results  Airway Mallampati: II  TM Distance: >3 FB Neck ROM: Full    Dental no notable dental hx.    Pulmonary neg pulmonary ROS,    Pulmonary exam normal breath sounds clear to auscultation       Cardiovascular hypertension, Pt. on medications negative cardio ROS   Rhythm:Regular Rate:Normal + Systolic murmurs    Neuro/Psych negative neurological ROS  negative psych ROS   GI/Hepatic negative GI ROS, Neg liver ROS,   Endo/Other  negative endocrine ROSdiabetes, Type 2  Renal/GU negative Renal ROS  negative genitourinary   Musculoskeletal negative musculoskeletal ROS (+)   Abdominal (+) + obese,   Peds negative pediatric ROS (+)  Hematology negative hematology ROS (+)   Anesthesia Other Findings Breast Cancer  Reproductive/Obstetrics negative OB ROS                            Anesthesia Physical Anesthesia Plan  ASA: 3  Anesthesia Plan: General   Post-op Pain Management: Regional block*, Dilaudid IV and Tylenol PO (pre-op)*   Induction: Intravenous  PONV Risk Score and Plan: 3 and Ondansetron, Dexamethasone, Midazolam and Treatment may vary due to age or medical condition  Airway Management Planned: LMA  Additional Equipment:   Intra-op Plan:   Post-operative Plan: Extubation in OR  Informed Consent: I have reviewed the patients History and Physical, chart, labs and discussed the procedure including the risks, benefits and alternatives for the proposed anesthesia with the patient or authorized representative who has indicated his/her understanding and acceptance.     Dental advisory given  Plan Discussed with: CRNA  Anesthesia Plan Comments: (PAT note by Karoline Caldwell, PA-C:  Evaluated patient  in the preop appointment due to a listed history of heart murmur.  Patient reports she was told by her PCP several years ago that she had a soft murmur.  She states no additional work-up was recommended.  She denies any history of palpitations, chest pain, shortness of breath, syncope/presyncope.  Denies any changes to her functional status, states she can go up flight of stairs without issue.  On auscultation, heart is regular rate and rhythm, soft 1 out of 6 systolic murmur.  Lungs CTAB.   Reviewed notes from PCPs office, last seen 04/07/2021 by Dr. Jonny Ruiz for annual checkup.  Chronic conditions stable, no concerns at that time, no murmur documented.  Preop labs reviewed, unremarkable.  Non-insulin-dependent DM2 well-controlled, A1c 6.5.  EKG 07/02/2021: Left axis deviation.  Rate 76. Moderate voltage criteria for LVH, may be normal variant ( R in aVL , Cornell product ). Cannot rule out Anterior infarct , age undetermined )       Anesthesia Quick Evaluation

## 2021-07-05 NOTE — Progress Notes (Signed)
Anesthesia Chart Review:  Evaluated patient in the preop appointment due to a listed history of heart murmur.  Patient reports she was told by her PCP several years ago that she had a soft murmur.  She states no additional work-up was recommended.  She denies any history of palpitations, chest pain, shortness of breath, syncope/presyncope.  Denies any changes to her functional status, states she can go up flight of stairs without issue.  On auscultation, heart is regular rate and rhythm, soft 1 out of 6 systolic murmur.  Lungs CTAB.   Reviewed notes from PCPs office, last seen 04/07/2021 by Dr. Jonny Ruiz for annual checkup.  Chronic conditions stable, no concerns at that time, no murmur documented.  Preop labs reviewed, unremarkable.  Non-insulin-dependent DM2 well-controlled, A1c 6.5.  EKG 07/02/2021: Left axis deviation.  Rate 76. Moderate voltage criteria for LVH, may be normal variant ( R in aVL , Cornell product ). Cannot rule out Anterior infarct , age undetermined  Wynonia Musty [facility] Short Stay Center/Anesthesiology Phone [phone] 07/05/2021 9:03 AM

## 2021-07-06 ENCOUNTER — Other Ambulatory Visit: Payer: Self-pay

## 2021-07-06 ENCOUNTER — Ambulatory Visit (HOSPITAL_BASED_OUTPATIENT_CLINIC_OR_DEPARTMENT_OTHER): Payer: Commercial Managed Care - HMO | Admitting: Physician Assistant

## 2021-07-06 ENCOUNTER — Encounter (HOSPITAL_BASED_OUTPATIENT_CLINIC_OR_DEPARTMENT_OTHER)
Admission: RE | Disposition: A | Discharge status: Home / Self Care | Payer: Self-pay | Source: Home / Self Care | Attending: General Surgery

## 2021-07-06 ENCOUNTER — Ambulatory Visit (HOSPITAL_BASED_OUTPATIENT_CLINIC_OR_DEPARTMENT_OTHER): Payer: Commercial Managed Care - HMO | Admitting: Vascular Surgery

## 2021-07-06 ENCOUNTER — Ambulatory Visit
Admission: RE | Admit: 2021-07-06 | Discharge: 2021-07-06 | Disposition: A | Discharge status: Home / Self Care | Payer: Commercial Managed Care - HMO | Source: Ambulatory Visit | Attending: General Surgery | Admitting: General Surgery

## 2021-07-06 ENCOUNTER — Ambulatory Visit (HOSPITAL_BASED_OUTPATIENT_CLINIC_OR_DEPARTMENT_OTHER)
Admission: RE | Admit: 2021-07-06 | Discharge: 2021-07-06 | Disposition: A | Discharge status: Home / Self Care | Payer: Commercial Managed Care - HMO | Attending: General Surgery | Admitting: General Surgery

## 2021-07-06 ENCOUNTER — Encounter (HOSPITAL_BASED_OUTPATIENT_CLINIC_OR_DEPARTMENT_OTHER): Payer: Self-pay | Admitting: General Surgery

## 2021-07-06 DIAGNOSIS — Z170 Estrogen receptor positive status [ER+]: Secondary | ICD-10-CM | POA: Insufficient documentation

## 2021-07-06 DIAGNOSIS — I10 Essential (primary) hypertension: Secondary | ICD-10-CM | POA: Insufficient documentation

## 2021-07-06 DIAGNOSIS — C50911 Malignant neoplasm of unspecified site of right female breast: Secondary | ICD-10-CM | POA: Diagnosis not present

## 2021-07-06 DIAGNOSIS — E669 Obesity, unspecified: Secondary | ICD-10-CM | POA: Insufficient documentation

## 2021-07-06 DIAGNOSIS — Z803 Family history of malignant neoplasm of breast: Secondary | ICD-10-CM | POA: Insufficient documentation

## 2021-07-06 DIAGNOSIS — E119 Type 2 diabetes mellitus without complications: Secondary | ICD-10-CM | POA: Diagnosis not present

## 2021-07-06 DIAGNOSIS — C50411 Malignant neoplasm of upper-outer quadrant of right female breast: Secondary | ICD-10-CM | POA: Insufficient documentation

## 2021-07-06 DIAGNOSIS — G4730 Sleep apnea, unspecified: Secondary | ICD-10-CM

## 2021-07-06 DIAGNOSIS — R928 Other abnormal and inconclusive findings on diagnostic imaging of breast: Secondary | ICD-10-CM

## 2021-07-06 HISTORY — PX: BREAST LUMPECTOMY WITH RADIOACTIVE SEED AND SENTINEL LYMPH NODE BIOPSY: SHX6550

## 2021-07-06 LAB — GLUCOSE, CAPILLARY
Glucose-Capillary: 110 mg/dL — ABNORMAL HIGH (ref 70–99)
Glucose-Capillary: 119 mg/dL — ABNORMAL HIGH (ref 70–99)

## 2021-07-06 SURGERY — BREAST LUMPECTOMY WITH RADIOACTIVE SEED AND SENTINEL LYMPH NODE BIOPSY
Anesthesia: General | Site: Breast | Laterality: Right

## 2021-07-06 MED ORDER — CEFAZOLIN SODIUM-DEXTROSE 2-4 GM/100ML-% IV SOLN
INTRAVENOUS | Status: AC
  Filled 2021-07-06: qty 100

## 2021-07-06 MED ORDER — LIDOCAINE HCL (CARDIAC) PF 100 MG/5ML IV SOSY
PREFILLED_SYRINGE | INTRAVENOUS | Status: DC | PRN
Start: 2021-07-06 — End: 2021-07-06
  Administered 2021-07-06: 60 mg via INTRAVENOUS

## 2021-07-06 MED ORDER — CEFAZOLIN SODIUM-DEXTROSE 2-4 GM/100ML-% IV SOLN
2.0000 g | INTRAVENOUS | Status: AC
Start: 2021-07-06 — End: 2021-07-06
  Administered 2021-07-06: 2 g via INTRAVENOUS

## 2021-07-06 MED ORDER — OXYCODONE HCL 5 MG/5ML PO SOLN: 5.0000 mg | Freq: Once | ORAL | Status: AC | PRN

## 2021-07-06 MED ORDER — MIDAZOLAM HCL 2 MG/2ML IJ SOLN
INTRAMUSCULAR | Status: AC
  Filled 2021-07-06: qty 2

## 2021-07-06 MED ORDER — DEXAMETHASONE SODIUM PHOSPHATE 10 MG/ML IJ SOLN
INTRAMUSCULAR | Status: DC | PRN
  Administered 2021-07-06: 4 mg via INTRAVENOUS

## 2021-07-06 MED ORDER — PROPOFOL 10 MG/ML IV BOLUS
INTRAVENOUS | Status: AC
  Filled 2021-07-06: qty 20

## 2021-07-06 MED ORDER — MAGTRACE LYMPHATIC TRACER
INTRAMUSCULAR | Status: DC | PRN
  Administered 2021-07-06: 2 mL via INTRAMUSCULAR

## 2021-07-06 MED ORDER — ONDANSETRON HCL 4 MG/2ML IJ SOLN
INTRAMUSCULAR | Status: DC | PRN
  Administered 2021-07-06: 4 mg via INTRAVENOUS

## 2021-07-06 MED ORDER — CHLORHEXIDINE GLUCONATE 0.12 % MT SOLN
15.0000 mL | Freq: Once | OROMUCOSAL | Status: DC
  Filled 2021-07-06: qty 15

## 2021-07-06 MED ORDER — PROPOFOL 10 MG/ML IV BOLUS
INTRAVENOUS | Status: DC | PRN
  Administered 2021-07-06: 200 mg via INTRAVENOUS

## 2021-07-06 MED ORDER — OXYCODONE HCL 5 MG PO TABS
ORAL_TABLET | ORAL | Status: AC
  Filled 2021-07-06: qty 1

## 2021-07-06 MED ORDER — OXYCODONE HCL 5 MG PO TABS
5.0000 mg | ORAL_TABLET | Freq: Once | ORAL | Status: AC | PRN
  Administered 2021-07-06: 5 mg via ORAL

## 2021-07-06 MED ORDER — HYDROMORPHONE HCL 1 MG/ML IJ SOLN: 0.2500 mg | INTRAMUSCULAR | Status: DC | PRN

## 2021-07-06 MED ORDER — CHLORHEXIDINE GLUCONATE CLOTH 2 % EX PADS: 6.0000 | MEDICATED_PAD | Freq: Once | CUTANEOUS | Status: DC

## 2021-07-06 MED ORDER — FENTANYL CITRATE (PF) 100 MCG/2ML IJ SOLN
INTRAMUSCULAR | Status: AC
  Filled 2021-07-06: qty 2

## 2021-07-06 MED ORDER — ORAL CARE MOUTH RINSE: 15.0000 mL | Freq: Once | OROMUCOSAL | Status: DC

## 2021-07-06 MED ORDER — LIDOCAINE 2% (20 MG/ML) 5 ML SYRINGE
INTRAMUSCULAR | Status: AC
  Filled 2021-07-06: qty 5

## 2021-07-06 MED ORDER — MEPERIDINE HCL 25 MG/ML IJ SOLN: 6.2500 mg | INTRAMUSCULAR | Status: DC | PRN

## 2021-07-06 MED ORDER — MIDAZOLAM HCL 2 MG/2ML IJ SOLN
2.0000 mg | Freq: Once | INTRAMUSCULAR | Status: AC
Start: 2021-07-06 — End: 2021-07-06
  Administered 2021-07-06: 2 mg via INTRAVENOUS

## 2021-07-06 MED ORDER — DEXAMETHASONE SODIUM PHOSPHATE 10 MG/ML IJ SOLN
INTRAMUSCULAR | Status: AC
  Filled 2021-07-06: qty 1

## 2021-07-06 MED ORDER — FENTANYL CITRATE (PF) 100 MCG/2ML IJ SOLN
100.0000 ug | Freq: Once | INTRAMUSCULAR | Status: AC
  Administered 2021-07-06: 100 ug via INTRAVENOUS

## 2021-07-06 MED ORDER — BUPIVACAINE HCL (PF) 0.25 % IJ SOLN
INTRAMUSCULAR | Status: DC | PRN
  Administered 2021-07-06: 4 mL

## 2021-07-06 MED ORDER — AMISULPRIDE (ANTIEMETIC) 5 MG/2ML IV SOLN: 10.0000 mg | Freq: Once | INTRAVENOUS | Status: DC | PRN

## 2021-07-06 MED ORDER — EPHEDRINE SULFATE (PRESSORS) 50 MG/ML IJ SOLN
INTRAMUSCULAR | Status: DC | PRN
  Administered 2021-07-06 (×3): 5 mg via INTRAVENOUS

## 2021-07-06 MED ORDER — OXYCODONE HCL 5 MG PO TABS: 5.0000 mg | ORAL_TABLET | Freq: Four times a day (QID) | ORAL | 0 refills | Status: DC | PRN

## 2021-07-06 MED ORDER — ONDANSETRON HCL 4 MG/2ML IJ SOLN
INTRAMUSCULAR | Status: AC
  Filled 2021-07-06: qty 2

## 2021-07-06 MED ORDER — ACETAMINOPHEN 500 MG PO TABS
ORAL_TABLET | ORAL | Status: AC
  Filled 2021-07-06: qty 2

## 2021-07-06 MED ORDER — ROPIVACAINE HCL 5 MG/ML IJ SOLN
INTRAMUSCULAR | Status: DC | PRN
  Administered 2021-07-06: 30 mL via PERINEURAL

## 2021-07-06 MED ORDER — ENSURE PRE-SURGERY PO LIQD: 296.0000 mL | Freq: Once | ORAL | Status: DC

## 2021-07-06 MED ORDER — LACTATED RINGERS IV SOLN: INTRAVENOUS | Status: DC

## 2021-07-06 MED ORDER — PROMETHAZINE HCL 25 MG/ML IJ SOLN: 6.2500 mg | INTRAMUSCULAR | Status: DC | PRN

## 2021-07-06 MED ORDER — ACETAMINOPHEN 500 MG PO TABS
1000.0000 mg | ORAL_TABLET | ORAL | Status: AC
  Administered 2021-07-06: 1000 mg via ORAL

## 2021-07-06 MED ORDER — PHENYLEPHRINE HCL (PRESSORS) 10 MG/ML IV SOLN
INTRAVENOUS | Status: DC | PRN
Start: 2021-07-06 — End: 2021-07-06
  Administered 2021-07-06: 10 ug via INTRAVENOUS

## 2021-07-06 SURGICAL SUPPLY — 47 items
ADH SKN CLS APL DERMABOND .7 (GAUZE/BANDAGES/DRESSINGS) ×1
APL PRP STRL LF DISP 70% ISPRP (MISCELLANEOUS) ×1
APPLIER CLIP 9.375 MED OPEN (MISCELLANEOUS) ×2
APR CLP MED 9.3 20 MLT OPN (MISCELLANEOUS) ×1
BLADE SURG 15 STRL LF DISP TIS (BLADE) ×1 IMPLANT
BLADE SURG 15 STRL SS (BLADE) ×2
CANISTER SUCT 1200ML W/VALVE (MISCELLANEOUS) ×1 IMPLANT
CHLORAPREP W/TINT 26 (MISCELLANEOUS) ×2 IMPLANT
CLIP APPLIE 9.375 MED OPEN (MISCELLANEOUS) IMPLANT
COVER BACK TABLE 60X90IN (DRAPES) ×2 IMPLANT
COVER MAYO STAND STRL (DRAPES) ×2 IMPLANT
COVER PROBE W GEL 5X96 (DRAPES) ×2 IMPLANT
DERMABOND ADVANCED (GAUZE/BANDAGES/DRESSINGS) ×1
DERMABOND ADVANCED .7 DNX12 (GAUZE/BANDAGES/DRESSINGS) ×1 IMPLANT
DRAPE LAPAROSCOPIC ABDOMINAL (DRAPES) ×2 IMPLANT
DRAPE UTILITY XL STRL (DRAPES) ×2 IMPLANT
ELECT COATED BLADE 2.86 ST (ELECTRODE) ×2 IMPLANT
ELECT REM PT RETURN 9FT ADLT (ELECTROSURGICAL) ×2
ELECTRODE REM PT RTRN 9FT ADLT (ELECTROSURGICAL) ×1 IMPLANT
GLOVE BIO SURGEON STRL SZ7 (GLOVE) ×4 IMPLANT
GLOVE BIOGEL PI IND STRL 7.5 (GLOVE) ×1 IMPLANT
GLOVE BIOGEL PI INDICATOR 7.5 (GLOVE) ×1
GOWN STRL REUS W/ TWL LRG LVL3 (GOWN DISPOSABLE) ×2 IMPLANT
GOWN STRL REUS W/TWL LRG LVL3 (GOWN DISPOSABLE) ×4
HEMOSTAT ARISTA ABSORB 3G PWDR (HEMOSTASIS) ×1 IMPLANT
KIT MARKER MARGIN INK (KITS) ×2 IMPLANT
NDL HYPO 25X1 1.5 SAFETY (NEEDLE) ×1 IMPLANT
NEEDLE HYPO 25X1 1.5 SAFETY (NEEDLE) ×2 IMPLANT
PACK BASIN DAY SURGERY FS (CUSTOM PROCEDURE TRAY) ×2 IMPLANT
PENCIL SMOKE EVACUATOR (MISCELLANEOUS) ×2 IMPLANT
SLEEVE SCD COMPRESS KNEE MED (STOCKING) ×2 IMPLANT
SPONGE T-LAP 4X18 ~~LOC~~+RFID (SPONGE) ×2 IMPLANT
STRIP CLOSURE SKIN 1/2X4 (GAUZE/BANDAGES/DRESSINGS) ×2 IMPLANT
SUT MNCRL AB 4-0 PS2 18 (SUTURE) ×2 IMPLANT
SUT MON AB 5-0 PS2 18 (SUTURE) IMPLANT
SUT SILK 2 0 SH (SUTURE) ×2 IMPLANT
SUT VIC AB 2-0 SH 27 (SUTURE) ×6
SUT VIC AB 2-0 SH 27XBRD (SUTURE) ×1 IMPLANT
SUT VIC AB 3-0 SH 27 (SUTURE) ×2
SUT VIC AB 3-0 SH 27X BRD (SUTURE) ×1 IMPLANT
SUT VIC AB 5-0 PS2 18 (SUTURE) IMPLANT
SYR CONTROL 10ML LL (SYRINGE) ×2 IMPLANT
TOWEL GREEN STERILE FF (TOWEL DISPOSABLE) ×2 IMPLANT
TRACER MAGTRACE VIAL (MISCELLANEOUS) ×1 IMPLANT
TRAY FAXITRON CT DISP (TRAY / TRAY PROCEDURE) ×2 IMPLANT
TUBE CONNECTING 20X1/4 (TUBING) ×1 IMPLANT
YANKAUER SUCT BULB TIP NO VENT (SUCTIONS) ×1 IMPLANT

## 2021-07-06 NOTE — Anesthesia Postprocedure Evaluation (Signed)
Anesthesia Post Note  Patient: Alexandria Hoover  Procedure(s) Performed: RIGHT BREAST LUMPECTOMY WITH RADIOACTIVE SEED AND AXILLARY SENTINEL LYMPH NODE BIOPSY (Right: Breast)     Patient location during evaluation: PACU Anesthesia Type: General Level of consciousness: awake and alert Pain management: pain level controlled Vital Signs Assessment: post-procedure vital signs reviewed and stable Respiratory status: spontaneous breathing, nonlabored ventilation and respiratory function stable Cardiovascular status: blood pressure returned to baseline and stable Postop Assessment: no apparent nausea or vomiting Anesthetic complications: no   No notable events documented.  Last Vitals:  Vitals:   07/06/21 1445 07/06/21 1500  BP: (!) 162/73 (!) 171/70  Pulse: 79 82  Resp: 11 12  Temp:  37 C  SpO2: 94% 98%    Last Pain:  Vitals:   07/06/21 1511  TempSrc:   PainSc: [place]

## 2021-07-06 NOTE — Anesthesia Procedure Notes (Signed)
Anesthesia Regional Block: Pectoralis block   Pre-Anesthetic Checklist: , timeout performed,  Correct Patient, Correct Site, Correct Laterality,  Correct Procedure, Correct Position, site marked,  Risks and benefits discussed,  Surgical consent,  Pre-op evaluation,  At surgeon's request and post-op pain management  Laterality: Right  Prep: chloraprep       Needles:  Injection technique: Single-shot  Needle Type: Stimiplex     Needle Length: 9cm  Needle Gauge: 21     Additional Needles:   Procedures:,,,, ultrasound used (permanent image in chart),,    Narrative:  Start time: 07/06/2021 12:16 PM End time: 07/06/2021 12:21 PM Injection made incrementally with aspirations every 5 mL.  Performed by: Personally  Anesthesiologist: Lynda Rainwater, MD

## 2021-07-06 NOTE — H&P (Signed)
66 y.o. female who is seen today as an office consultation for evaluation of Breast Cancer She had screening mammogram that showed a right breast distortion. She does have fh in her sister with breast cancer. She has c density breasts. She has a 1.7 cm distortion with calcifications. Her ax u/s is negative. 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  I have reviewed her mm/us/pathology and discussed case with rad onc, med onc, radiology and pathology.  Review of Systems: A complete review of systems was obtained from the patient. I have reviewed this information and discussed as appropriate with the patient. See HPI as well for other ROS.  Review of Systems  Cardiovascular: Positive for leg swelling (feet).  All other systems reviewed and are negative.   Medical History: Past Medical History:  Diagnosis Date   Diabetes mellitus without complication (CMS-HCC)   Hyperlipidemia   Hypertension   Patient Active Problem List  Diagnosis   Malignant neoplasm of upper-outer quadrant of right breast in female, estrogen receptor positive (CMS-HCC)   Past Surgical History:  Procedure Laterality Date   HYSTERECTOMY   LAPAROSCOPIC TUBAL LIGATION    No Known Allergies  Current Outpatient Medications on File Prior to Visit  Medication Sig Dispense Refill   metFORMIN (GLUCOPHAGE-XR) 500 MG XR tablet 1 tablet with evening meal   pravastatin (PRAVACHOL) 40 MG tablet 1 tablet   No current facility-administered medications on file prior to visit.   Family History  Problem Relation Age of Onset   Obesity Sister   Diabetes Sister   Breast cancer Sister    Social History   Tobacco Use  Smoking Status Never  Smokeless Tobacco Never    Social History   Socioeconomic History   Marital status: Unknown  Tobacco Use   Smoking status: Never   Smokeless tobacco: Never  Substance and Sexual Activity   Alcohol use: Not  Currently   Drug use: Not Currently   Objective:  Physical Exam Constitutional:  Appearance: Normal appearance.  Chest:  Breasts: Right: No inverted nipple, mass or nipple discharge.  Left: No inverted nipple, mass or nipple discharge.  Comments: Hematoma ruoq Lymphadenopathy:  Upper Body:  Right upper body: No supraclavicular or axillary adenopathy.  Left upper body: No supraclavicular or axillary adenopathy.  Neurological:  Mental Status: She is alert.    Assessment and Plan:   Malignant neoplasm of upper-outer quadrant of right breast in female, estrogen receptor positive (CMS-HCC)  Right breast seed guided lumpectomy, right ax sn biopsy   We discussed the staging and pathophysiology of breast cancer. We discussed all of the different options for treatment for breast cancer including surgery, chemotherapy, radiation therapy, Herceptin, and antiestrogen therapy. We discussed a sentinel lymph node biopsy as she does not appear to having lymph node involvement right now. We discussed the performance of that with injection of tracer. We discussed that there is a chance of having a positive node with a sentinel lymph node biopsy and we will await the permanent pathology to make any other first further decisions in terms of her treatment. We discussed up to a 5% risk lifetime of chronic shoulder pain as well as lymphedema associated with a sentinel lymph node biopsy. We discussed the options for treatment of the breast cancer which included lumpectomy versus a mastectomy. We discussed the performance of the lumpectomy with radioactive seed placement. We discussed a  5-10% chance of a positive margin requiring reexcision in the operating room. We also discussed that she will likely need radiation therapy if she undergoes lumpectomy. We discussed mastectomy and the postoperative care for that as well. Mastectomy can be followed by reconstruction. The decision for lumpectomy vs mastectomy has  no impact on decision for chemotherapy. Most mastectomy patients will not need radiation therapy. We discussed that there is no difference in her survival whether she undergoes lumpectomy with radiation therapy or antiestrogen therapy versus a mastectomy. There is also no real difference between her recurrence in the breast. We discussed the risks of operation including bleeding, infection, possible reoperation. She understands her further therapy will be based on what her stages at the time of her operation.

## 2021-07-06 NOTE — Progress Notes (Signed)
Assisted Dr. Miller with right, pectoralis, ultrasound guided block. Side rails up, monitors on throughout procedure. See vital signs in flow sheet. Tolerated Procedure well. 

## 2021-07-06 NOTE — Op Note (Signed)
Preoperative diagnosis: Clinical stage I right breast cancer Postoperative diagnosis: Same as above Procedure: 1.  Right breast radioactive seed guided lumpectomy 2.  Right deep axillary sentinel lymph node biopsy 3.  Injection of mag trace for sentinel lymph node identification Surgeon: Dr. Serita Grammes Anesthesia: General with a pectoral block Estimated blood loss: Minimal Complications: None Drains: None Specimens: 1.  Right breast lumpectomy containing seed and clip marked with paint 2.  Right deep axillary sentinel lymph nodes with highest count of 5235 3. Right breast posterior, lateral , medial, superior and inferior margins marked short superior, long lateral and double deep Sponge needle count was correct at completion Decision to recovery stable condition  Indications: 66 y.o. female who had screening mammogram that showed a right breast distortion. She does have fh in her sister with breast cancer. She has c density breasts. She has a 1.7 cm distortion with calcifications. Her ax u/s is negative. 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 We discussed lumpectomy and sn biopsy.   Procedure: She first had a seed placed by radiology.  I had these mammograms available for my review.  After informed consent was obtained she then underwent a pectoral block. I also prepped the area lateral to the areola.  I injected 2 cc of mag trace in the subareolar position without complication.  She was given antibiotics.  SCDs were placed.  She was then placed under general anesthesia without complication.  She was prepped and draped in the standard sterile surgical fashion.  A surgical timeout was then performed.  I infiltrated lidocaine over the tumor. I then made a curvilinear incision over the tumor due to the proximity of the mass to the skin. I then dissected to the seed. I removed the seed and the surrounding tissue with an  attempt to get a clear margin. Mammogram confirmed removal of the seed and the clip.  I removed additional margins as above and shaved the cavity. Hemostasis was observed. I closed this with 2-0 vicryl for the breast tissue. I then closed skin with 3-0 vicryl and 5-0 monocryl. Glue was placed.    I made a low axillary incision.I dissected through the axillary fascia.  I used the probe to identify what appeared to be several normal-appearing nodes that contained magtrace.  There was no other activity once I was completed this.  These were then passed off the table.  Hemostasis was observed.  I closed the breast tissue with 2-0 vicryl as well.  I then closed this with 2-0 Vicryl, 3-0 Vicryl, and 4-0 Monocryl. Glue and Steri-Strips were applied.  she tolerated well, was extubated and transferred to recovery stable

## 2021-07-06 NOTE — Interval H&P Note (Signed)
History and Physical Interval Note:  07/06/2021 12:31 PM  Alexandria Hoover  has presented today for surgery, with the diagnosis of RIGHT BREAST Hoover.  The various methods of treatment have been discussed with the patient and family. After consideration of risks, benefits and other options for treatment, the patient has consented to  Procedure(s): RIGHT BREAST LUMPECTOMY WITH RADIOACTIVE SEED AND AXILLARY SENTINEL LYMPH NODE BIOPSY (Right) as a surgical intervention.  The patient's history has been reviewed, patient examined, no change in status, stable for surgery.  I have reviewed the patient's chart and labs.  Questions were answered to the patient's satisfaction.     Rolm Bookbinder

## 2021-07-06 NOTE — Discharge Instructions (Addendum)
No Tylenol until 4:30 today if needed  [employer] Phone Number [phone]  POST OP INSTRUCTIONS Take 400 mg of ibuprofen every 8 hours or 650 mg tylenol every 6 hours for next 72 hours then as needed. Use ice several times daily also.   A prescription for pain medication may be given to you upon discharge.  Take your pain medication as prescribed, if needed.  If narcotic pain medicine is not needed, then you may take acetaminophen (Tylenol), naprosyn (Alleve) or ibuprofen (Advil) as needed. Take your usually prescribed medications unless otherwise directed If you need a refill on your pain medication, please contact your pharmacy.  They will contact our office to request authorization.  Prescriptions will not be filled after 5pm or on week-ends. You should eat very light the first 24 hours after surgery, such as soup, crackers, pudding, etc.  Resume your normal diet the day after surgery. Most patients will experience some swelling and bruising in the breast.  Ice packs and a good support bra will help.  Wear the breast binder provided or a sports bra for 72 hours day and night.  After that wear a sports bra during the day until you return to the office. Swelling and bruising can take several days to resolve.  It is common to experience some constipation if taking pain medication after surgery.  Increasing fluid intake and taking a stool softener will usually help or prevent this problem from occurring.  A mild laxative (Milk of Magnesia or Miralax) should be taken according to package directions if there are no bowel movements after 48 hours. 7. I used skin glue on the incision, you may shower in 24 hours.  The glue will flake off over the next 2-3 weeks.  Any sutures or staples will be removed at the office during your follow-up visit. ACTIVITIES:  You may resume regular daily activities (gradually increasing) beginning the next day.  Wearing a good support bra or sports  bra minimizes pain and swelling.  You may have sexual intercourse when it is comfortable. You may drive when you no longer are taking prescription pain medication, you can comfortably wear a seatbelt, and you can safely maneuver your car and apply brakes. RETURN TO WORK:  ______________________________________________________________________________________ Dennis Bast should see your doctor in the office for a follow-up appointment approximately two weeks after your surgery.  Your doctor's nurse will typically make your follow-up appointment when she calls you with your pathology report.  Expect your pathology report 3-4 business days after your surgery.  You may call to check if you do not hear from [place] after three days. OTHER INSTRUCTIONS: _______________________________________________________________________________________________ _____________________________________________________________________________________________________________________________________ _____________________________________________________________________________________________________________________________________ _____________________________________________________________________________________________________________________________________  WHEN TO CALL DR WAKEFIELD: Fever over 101.0 Nausea and/or vomiting. Extreme swelling or bruising. Continued bleeding from incision. Increased pain, redness, or drainage from the incision.  The clinic staff is available to answer your questions during regular business hours.  Please don't hesitate to call and ask to speak to one of the nurses for clinical concerns.  If you have a medical emergency, go to the nearest emergency room or call 911.  A surgeon from [facility] is always on call at the hospital.  For further questions, please visit [email or web address]   Post Anesthesia Home Care Instructions  Activity: Get plenty of rest for the remainder of  the day. A responsible individual must stay with you for 24 hours following the procedure.  For the next 24 hours, DO NOT: -Drive a car -Operate  machinery -Drink alcoholic beverages -Take any medication unless instructed by your physician -Make any legal decisions or sign important papers.  Meals: Start with liquid foods such as gelatin or soup. Progress to regular foods as tolerated. Avoid greasy, spicy, heavy foods. If nausea and/or vomiting occur, drink only clear liquids until the nausea and/or vomiting subsides. Call your physician if vomiting continues.  Special Instructions/Symptoms: Your throat may feel dry or sore from the anesthesia or the breathing tube placed in your throat during surgery. If this causes discomfort, gargle with warm salt water. The discomfort should disappear within 24 hours.  If you had a scopolamine patch placed behind your ear for the management of post- operative nausea and/or vomiting:  1. The medication in the patch is effective for 72 hours, after which it should be removed.  Wrap patch in a tissue and discard in the trash. Wash hands thoroughly with soap and water. 2. You may remove the patch earlier than 72 hours if you experience unpleasant side effects which may include dry mouth, dizziness or visual disturbances. 3. Avoid touching the patch. Wash your hands with soap and water after contact with the patch.

## 2021-07-06 NOTE — Transfer of Care (Signed)
Immediate Anesthesia Transfer of Care Note  Patient: Alexandria Hoover  Procedure(s) Performed: RIGHT BREAST LUMPECTOMY WITH RADIOACTIVE SEED AND AXILLARY SENTINEL LYMPH NODE BIOPSY (Right: Breast)  Patient Location: PACU  Anesthesia Type:GA combined with regional for post-op pain  Level of Consciousness: drowsy  Airway & Oxygen Therapy: Patient Spontanous Breathing and Patient connected to face mask oxygen  Post-op Assessment: Report given to RN and Post -op Vital signs reviewed and stable  Post vital signs: Reviewed and stable  Last Vitals:  Vitals Value Taken Time  BP 171/70 07/06/21 1500  Temp 37 C 07/06/21 1500  Pulse 82 07/06/21 1500  Resp 12 07/06/21 1500  SpO2 98 % 07/06/21 1500    Last Pain:  Vitals:   07/06/21 1500  TempSrc:   PainSc: 3       Patients Stated Pain Goal: 4 (80/03/49 1791)  Complications: No notable events documented.

## 2021-07-06 NOTE — Progress Notes (Signed)
Assisted Dr. Donne Hazel with magtrace injection. Patient tolerated procedure well.

## 2021-07-06 NOTE — Interval H&P Note (Signed)
History and Physical Interval Note:  07/06/2021 11:15 AM  Alexandria Hoover  has presented today for surgery, with the diagnosis of RIGHT BREAST Hoover.  The various methods of treatment have been discussed with the patient and family. After consideration of risks, benefits and other options for treatment, the patient has consented to  Procedure(s): RIGHT BREAST LUMPECTOMY WITH RADIOACTIVE SEED AND AXILLARY SENTINEL LYMPH NODE BIOPSY (Right) as a surgical intervention.  The patient's history has been reviewed, patient examined, no change in status, stable for surgery.  I have reviewed the patient's chart and labs.  Questions were answered to the patient's satisfaction.     Rolm Bookbinder

## 2021-07-06 NOTE — Anesthesia Procedure Notes (Signed)
Procedure Name: LMA Insertion Date/Time: 07/06/2021 1:00 PM Performed by: Lavonia Dana, CRNA Pre-anesthesia Checklist: Patient identified, Emergency Drugs available, Suction available and Patient being monitored Patient Re-evaluated:Patient Re-evaluated prior to induction Oxygen Delivery Method: Circle system utilized Preoxygenation: Pre-oxygenation with 100% oxygen Induction Type: IV induction Ventilation: Mask ventilation without difficulty LMA: LMA inserted LMA Size: 4.0 Number of attempts: 1 Airway Equipment and Method: Bite block Placement Confirmation: positive ETCO2 Tube secured with: Tape Dental Injury: Teeth and Oropharynx as per pre-operative assessment

## 2021-07-07 ENCOUNTER — Encounter (HOSPITAL_BASED_OUTPATIENT_CLINIC_OR_DEPARTMENT_OTHER): Payer: Self-pay | Admitting: General Surgery

## 2021-07-08 LAB — SURGICAL PATHOLOGY

## 2021-07-09 ENCOUNTER — Telehealth: Payer: Self-pay | Admitting: Genetic Counselor

## 2021-07-09 NOTE — Telephone Encounter (Signed)
I contacted Ms. Maziarz to discuss her genetic testing results. No pathogenic variants were identified in the 47 genes analyzed. Detailed clinic note to follow.  The test report has been scanned into EPIC and is located under the Molecular Pathology section of the Results Review tab.  A portion of the result report is included below for reference.   Lucille Passy, MS, [facility] Genetic Counselor [email or web address] (P) [phone]

## 2021-07-13 ENCOUNTER — Encounter: Payer: Self-pay | Admitting: *Deleted

## 2021-07-13 DIAGNOSIS — C50411 Malignant neoplasm of upper-outer quadrant of right female breast: Secondary | ICD-10-CM

## 2021-07-14 ENCOUNTER — Encounter: Payer: Self-pay | Admitting: Genetic Counselor

## 2021-07-16 ENCOUNTER — Telehealth: Payer: Self-pay | Admitting: *Deleted

## 2021-07-16 NOTE — Telephone Encounter (Signed)
Spoke with patient regarding some questions she had about her upcoming appointments. Appointments confirmed.

## 2021-07-21 ENCOUNTER — Encounter: Payer: Self-pay | Admitting: *Deleted

## 2021-07-22 ENCOUNTER — Encounter (HOSPITAL_COMMUNITY): Payer: Self-pay

## 2021-07-23 ENCOUNTER — Ambulatory Visit: Payer: Self-pay | Admitting: Genetic Counselor

## 2021-07-23 DIAGNOSIS — Z1379 Encounter for other screening for genetic and chromosomal anomalies: Secondary | ICD-10-CM

## 2021-07-23 NOTE — Progress Notes (Signed)
HPI:   Ms. Howatt was previously seen in the [place] clinic due to a personal and family history of cancer and concerns regarding a hereditary predisposition to cancer. Please refer to our prior cancer genetics clinic note for more information regarding our discussion, assessment and recommendations, at the time. Ms. Zuckerman recent genetic test results were disclosed to her, as were recommendations warranted by these results. These results and recommendations are discussed in more detail below.  CANCER HISTORY:  Oncology History  Malignant neoplasm of upper-outer quadrant of right breast in female, estrogen receptor positive ([place])  04/12/2021 Mammogram   Mammogram from April 12, 2021 showed possible distortion in the right breast and additional imaging was recommended. Diagnostic mammogram confirmed indeterminate architectural distortion involving the upper outer quadrant of the right breast at middle depth adjacent to a group of benign calcifications.  Stereotactic tomosynthesis core needle biopsy of the right breast was recommended   06/11/2021 Pathology Results   Pathology from the breast biopsy showed invasive mammary carcinoma ductal subtype, grade 1.  Prognostic showed ER 100% positive strong staining PR 100% positive strong staining, KI of 2% and HER2 by FISH pending   06/21/2021 Initial Diagnosis   Malignant neoplasm of upper-outer quadrant of right breast in female, estrogen receptor positive ([place])   06/23/2021 Cancer Staging   Staging form: Breast, AJCC 8th Edition - Clinical stage from 06/23/2021: Stage IA (cT1c, cN0, cM0, G1, ER+, PR+, HER2-) - Signed by Benay Pike, MD on 06/23/2021 Stage prefix: Initial diagnosis Histologic grading system: 3 grade system Laterality: Right Staged by: Pathologist and managing physician Stage used in treatment planning: Yes National guidelines used in treatment planning: Yes Type of national guideline used in treatment  planning: NCCN    Genetic Testing   [facility] CustomNext Panel was Negative. Report date is 07/05/2021.  The CustomNext-Cancer+RNAinsight panel offered by Althia Forts includes sequencing and rearrangement analysis for the following 47 genes:  APC, ATM, AXIN2, BARD1, BMPR1A, BRCA1, BRCA2, BRIP1, CDH1, CDK4, CDKN2A, CHEK2, CTNNA1, DICER1, EPCAM, GREM1, HOXB13, KIT, MEN1, MLH1, MSH2, MSH3, MSH6, MUTYH, NBN, NF1, NTHL1, PALB2, PDGFRA, PMS2, POLD1, POLE, PTEN, RAD50, RAD51C, RAD51D, SDHA, SDHB, SDHC, SDHD, SMAD4, SMARCA4, STK11, TP53, TSC1, TSC2, and VHL.  RNA data is routinely analyzed for use in variant interpretation for all genes.     FAMILY HISTORY:  We obtained a detailed, 4-generation family history.  Significant diagnoses are listed below:      Family History  Problem Relation Age of Onset   Throat cancer Father     Breast cancer Sister 54   Pancreatic cancer Brother          maternal half-brother   Throat cancer Brother          maternal half-brother   Breast cancer Paternal Aunt     Prostate cancer Paternal Uncle     Prostate cancer Cousin         Ms. Kovaleski's sister was diagnosed with breast cancer at age 31. Her maternal half-brother was diagnosed with pancreatic cancer and a second maternal half-brother was diagnosed with throat cancer. Ms. Lavey father was diagnosed with throat cancer, he smoked and died at age 59. Her paternal aunt was diagnosed with breast cancer, she is deceased. Her paternal uncle was diagnosed with prostate cancer, he is deceased. She has one paternal cousin who was diagnosed with prostate cancer. Ms. Madura is unaware of previous family history of genetic testing for hereditary cancer risks. There is no reported Ashkenazi [place]  ancestry.   GENETIC TEST RESULTS:  The [employer] CustomNext Panel found no pathogenic mutations.   The CustomNext-Cancer+RNAinsight panel offered by Althia Forts includes sequencing and rearrangement analysis for the following  47 genes:  APC, ATM, AXIN2, BARD1, BMPR1A, BRCA1, BRCA2, BRIP1, CDH1, CDK4, CDKN2A, CHEK2, CTNNA1, DICER1, EPCAM, GREM1, HOXB13, KIT, MEN1, MLH1, MSH2, MSH3, MSH6, MUTYH, NBN, NF1, NTHL1, PALB2, PDGFRA, PMS2, POLD1, POLE, PTEN, RAD50, RAD51C, RAD51D, SDHA, SDHB, SDHC, SDHD, SMAD4, SMARCA4, STK11, TP53, TSC1, TSC2, and VHL.  RNA data is routinely analyzed for use in variant interpretation for all genes.  The test report has been scanned into EPIC and is located under the Molecular Pathology section of the Results Review tab.  A portion of the result report is included below for reference. Genetic testing reported out on 07/05/2021.        Even though a pathogenic variant was not identified, possible explanations for the cancer in the family may include: There may be no hereditary risk for cancer in the family. The cancers in Ms. Poythress and/or her family may be due to other genetic or environmental factors. There may be a gene mutation in one of these genes that current testing methods cannot detect, but that chance is small. There could be another gene that has not yet been discovered, or that we have not yet tested, that is responsible for the cancer diagnoses in the family.  It is also possible there is a hereditary cause for the cancer in the family that Ms. Hemrick did not inherit.  Therefore, it is important to remain in touch with cancer genetics in the future so that we can continue to offer Ms. Wyles the most up to date genetic testing.   ADDITIONAL GENETIC TESTING:  We discussed with Ms. Gammell that her genetic testing was fairly extensive.  If there are genes identified to increase cancer risk that can be analyzed in the future, we would be happy to discuss and coordinate this testing at that time.    CANCER SCREENING RECOMMENDATIONS:  Ms. Chain test result is considered negative (normal).  This means that we have not identified a hereditary cause for her personal and family  history of cancer at this time.   An individual's cancer risk and medical management are not determined by genetic test results alone. Overall cancer risk assessment incorporates additional factors, including personal medical history, family history, and any available genetic information that may result in a personalized plan for cancer prevention and surveillance. Therefore, it is recommended she continue to follow the cancer management and screening guidelines provided by her oncology and primary healthcare provider.  RECOMMENDATIONS FOR FAMILY MEMBERS:   Since she did not inherit a mutation in a cancer predisposition gene included on this panel, her son could not have inherited a mutation from her in one of these genes. Individuals in this family might be at some increased risk of developing cancer, over the general population risk, due to the family history of cancer. We recommend women in this family have a yearly mammogram beginning at age 10, or 58 years younger than the earliest onset of cancer, an annual clinical breast exam, and perform monthly breast self-exams.  Other members of the family may still carry a pathogenic variant in one of these genes that Ms. Shaw did not inherit. Based on the family history, we recommend her sister have genetic counseling and testing.   FOLLOW-UP:  Cancer genetics is a rapidly advancing field and it is possible that new  genetic tests will be appropriate for her and/or her family members in the future. We encouraged her to remain in contact with cancer genetics on an annual basis so we can update her personal and family histories and let her know of advances in cancer genetics that may benefit this family.   Our contact number was provided. Ms. Mccurley questions were answered to her satisfaction, and she knows she is welcome to call us at anytime with additional questions or concerns.   Lucille Passy, MS, [facility] Genetic  Counselor [email or web address] (P) [phone]

## 2021-07-27 ENCOUNTER — Ambulatory Visit: Payer: Medicare Other | Attending: General Surgery | Admitting: Physical Therapy

## 2021-07-27 DIAGNOSIS — M25612 Stiffness of left shoulder, not elsewhere classified: Secondary | ICD-10-CM | POA: Diagnosis present

## 2021-07-27 DIAGNOSIS — C50411 Malignant neoplasm of upper-outer quadrant of right female breast: Secondary | ICD-10-CM | POA: Insufficient documentation

## 2021-07-27 DIAGNOSIS — Z170 Estrogen receptor positive status [ER+]: Secondary | ICD-10-CM | POA: Diagnosis present

## 2021-07-27 DIAGNOSIS — R293 Abnormal posture: Secondary | ICD-10-CM | POA: Diagnosis present

## 2021-07-27 DIAGNOSIS — Z483 Aftercare following surgery for neoplasm: Secondary | ICD-10-CM | POA: Insufficient documentation

## 2021-07-27 DIAGNOSIS — M25611 Stiffness of right shoulder, not elsewhere classified: Secondary | ICD-10-CM | POA: Diagnosis present

## 2021-07-27 NOTE — Therapy (Signed)
OUTPATIENT PHYSICAL THERAPY BREAST CANCER POST OP FOLLOW UP   Patient Name: Alexandria Hoover MRN: [id number] DOB:[birth year], 66 y.o., female Today's Date: 07/27/2021   PT End of Session - 07/27/21 0950     Visit Number 2    Number of Visits 6    Date for PT Re-Evaluation 08/18/21    PT Start Time 0900    PT Stop Time 0945    PT Time Calculation (min) 45 min    Activity Tolerance Patient tolerated treatment well    Behavior During Therapy [facility] for tasks assessed/performed             Past Medical History:  Diagnosis Date   Breast cancer ([place]) 2023   Diabetes ([place])    type 2   Heart murmur    Hypercholesteremia    Hypertension    Past Surgical History:  Procedure Laterality Date   ABDOMINAL HYSTERECTOMY     BREAST BIOPSY Right 06/11/2021   BREAST EXCISIONAL BIOPSY Right over 10 yrs   BREAST LUMPECTOMY WITH RADIOACTIVE SEED AND SENTINEL LYMPH NODE BIOPSY Right 07/06/2021   Procedure: RIGHT BREAST LUMPECTOMY WITH RADIOACTIVE SEED AND AXILLARY SENTINEL LYMPH NODE BIOPSY;  Surgeon: Rolm Bookbinder, MD;  Location: [place];  Service: General;  Laterality: Right;   TUBAL LIGATION     WISDOM TOOTH EXTRACTION     Patient Active Problem List   Diagnosis Date Noted   Genetic testing 06/30/2021   Family history of breast cancer 06/24/2021   Family history of prostate cancer 06/24/2021   Malignant neoplasm of upper-outer quadrant of right breast in female, estrogen receptor positive ([place]) 06/21/2021      REFERRING PROVIDER: Dr. Donne Hazel   REFERRING DIAG: Malignant neoplasm of upper-outer quadrant of right breast in female, estrogen receptor positive ([place])  THERAPY DIAG:  Malignant neoplasm of upper-outer quadrant of right breast in female, estrogen receptor positive ([place])  Abnormal posture  Stiffness of right shoulder, not elsewhere classified  Aftercare following surgery for neoplasm  Rationale for Evaluation and Treatment  Rehabilitation  ONSET DATE: 04/12/2021  SUBJECTIVE:                                                                                                                                                                                           SUBJECTIVE STATEMENT: I'm doing ok . I'm going back to work today.  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 The weight varies.   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 It measures 1.7 cm and is located  in the upper outer quadrant. It is ER/PR positive and HER2 negative with a Ki67 of 2%. Also has hypertension and diabetes. Pt had lumpectomy with 0/3 sentinel nodes removed on 07/06/2021 . Pt will be starting radiation on 08/02/2021    PATIENT GOALS:  Reassess how my recovery is going related to arm function, pain, and swelling.  PAIN:  Are you having pain? No  PRECAUTIONS: Recent Surgery, right UE Lymphedema risk,   ACTIVITY LEVEL / LEISURE: Pt has not been walking but she has been doing the arm exercises that they gave her    OBJECTIVE:   PATIENT SURVEYS:  QUICK DASH: 22.73  OBSERVATIONS:  Pt has well healing incisions in axilla and breast with steri strips still intact  POSTURE:  Forward head, forward shoulders, increased thoracic kyphosis  LYMPHEDEMA ASSESSMENT:  UPPER EXTREMITY AROM/PROM:   A/PROM RIGHT  06/23/2021   Right 07/27/2021  Shoulder extension 48   Shoulder flexion 145 135  Shoulder abduction 125 120  Shoulder internal rotation 82   Shoulder external rotation 48                           (Blank rows = not tested)   A/PROM LEFT  06/23/2021 Left 07/27/2021  Shoulder extension 53   Shoulder flexion 125 145  Shoulder abduction 126 130  Shoulder internal rotation 70   Shoulder external rotation 56                           (Blank rows = not tested)    LYMPHEDEMA ASSESSMENTS:    LANDMARK RIGHT  06/23/2021 RIght 08/06/2021  10  cm proximal to olecranon process 35.3 36.5  Olecranon process 30.1 30.5  10 cm proximal to ulnar styloid process 26 27.3  Just proximal to ulnar styloid process 18.7 18  Across hand at thumb web space 20.4 20.8  At base of 2nd digit 6.8 6.6  (Blank rows = not tested)   LANDMARK LEFT  06/23/2021 Left 08/06/2021  10 cm proximal to olecranon process 36.8 37.5  Olecranon process 30.7 31  10  cm proximal to ulnar styloid process 26.2 25  Just proximal to ulnar styloid process 20.5 19.5  Across hand at thumb web space 20.4 20  At base of 2nd digit 6.8 6.8  (Blank rows = not tested)      Surgery type/Date: right lumpectomy  Number of lymph nodes removed: 3 Current/past treatment (chemo, radiation, hormone therapy): will start radiation soon  Other symptoms:  Heaviness/tightness No Pain No Pitting edema No Infections No Decreased scar mobility: not tested  Stemmer sign No   PATIENT EDUCATION:  Education details: supine dowel rod shoulder exercises  Person educated: Patient Education method: Consulting civil engineer, Media planner, Verbal cues, and Handouts Education comprehension: returned demonstration   HOME EXERCISE PROGRAM:  Reviewed previously given post op HEP. Upgreaded to supine stretches and simulation of radiation position   ASSESSMENT:  CLINICAL IMPRESSION: Pt is doing well post surgery but has not yet regained full shoulder ROM. She plans to return to work today and will be starting radiation next week. She will not have time to see the ABC class online so would benefit from inperson instruction during her session to increase ROM and shoulder strength for return to work   Pt will benefit from skilled therapeutic intervention to improve on the following deficits: Decreased knowledge of precautions, impaired UE functional use, pain, decreased ROM, postural dysfunction.  PT treatment/interventions: ADL/Self care home management, Therapeutic exercises, Therapeutic activity,  Neuromuscular re-education, Balance training, Gait training, Patient/Family education, Joint mobilization, and Aquatic Therapy     GOALS: Goals reviewed with patient? Yes  LONG TERM GOALS:  (STG=LTG)  GOALS Name Target Date  Goal status  1 Pt will demonstrate she has regained full shoulder ROM and function post operatively compared to baselines.  Baseline: 08/10/2021 In progress  2 Pt will verbalize understanding of lymphedema risk reduction practices  08/10/2021 INITIAL  3 Pt will be independent in HEP for ongoing shoulder ROM during radiation and strength for return to work  08/10/2021 INITIAL  4        PLAN: PT FREQUENCY/DURATION: 2-4 visits   PLAN FOR NEXT SESSION: teach lymphedema risk reduction practices as pt may not be able to attend ABC class or schedule for ABC class, measure Shoulder ROM, P/AROM as needed . Progress to supine scap and theraband strengthening for UEs, encourage walking program for return to work Make sure she is scheduled for Ldex follow up     Donato Heinz. Owens Shark PT  Norwood Levo, PT 07/27/2021, 9:52 AM

## 2021-07-27 NOTE — Patient Instructions (Signed)
SHOULDER: Flexion - Supine (Cane)        Cancer Rehab [phone] ° ° ° °Hold cane in both hands. Raise arms up overhead. Do not allow back to arch. Hold _5__ seconds. Do __5-10__ times; __1-2__ times a day. ° ° °SELF ASSISTED WITH OBJECT: Shoulder Abduction / Adduction - Supine ° ° ° °Hold cane with both hands. Move both arms from side to side, keep elbows straight.  Hold when stretch felt for __5__ seconds. Repeat __5-10__ times; __1-2__ times a day. Once this becomes easier progress to third picture bringing affected arm towards ear by staying out to side. Same hold for _5_seconds. Repeat  _5-10_ times, _1-2_ times/day. ° °Shoulder Blade Stretch ° ° ° °Clasp fingers behind head with elbows touching in front of face. Pull elbows back while pressing shoulder blades together. Relax and hold as tolerated, can place pillow under elbow here for comfort as needed and to allow for prolonged stretch.  °Repeat __5__ times. Do __1-2__ sessions per day. ° ° ° ° ° ° °

## 2021-07-28 NOTE — Progress Notes (Signed)
Location of Breast Cancer: upper-outer quadrant of right breast   Histology per Pathology Report: Right breast Invasive ductal carcinoma, grade 1  Receptor Status: ER(100%), PR (100%), Her2-neu (negative), Ki-(2%)  Did patient present with symptoms (if so, please note symptoms) or was this found on screening mammography?: screening mammogram  Past/Anticipated interventions by surgeon, if any: Right breast radioactive seed guided lumpectomy, right deep axillary sentinel lymph node biopsy, injection of mag trace for sentinel lymph node identification on 07/06/2021 by Dr. Wakefield  Past/Anticipated interventions by medical oncology, if any: Antiestrogen therapy to follow radiation therapy.  Lymphedema issues, if any:  no    Pain issues, if any:  no   SAFETY ISSUES: Prior radiation? no Pacemaker/ICD? no Possible current pregnancy?no Is the patient on methotrexate? no  Current Complaints / other details:    BP (!) 169/81 (BP Location: Left Arm, Patient Position: Sitting)   Pulse 79   Temp (!) 97 F (36.1 C) (Temporal)   Resp 18   Ht 5' 4" (1.626 m)   SpO2 97%   BMI 33.49 kg/m      Hess, Karen R, RN 07/28/2021,1:03 PM   

## 2021-07-30 ENCOUNTER — Ambulatory Visit: Payer: Medicare Other | Admitting: Physical Therapy

## 2021-07-30 ENCOUNTER — Encounter: Payer: Self-pay | Admitting: Physical Therapy

## 2021-07-30 DIAGNOSIS — M25611 Stiffness of right shoulder, not elsewhere classified: Secondary | ICD-10-CM

## 2021-07-30 DIAGNOSIS — R293 Abnormal posture: Secondary | ICD-10-CM

## 2021-07-30 DIAGNOSIS — C50411 Malignant neoplasm of upper-outer quadrant of right female breast: Secondary | ICD-10-CM | POA: Diagnosis not present

## 2021-07-30 DIAGNOSIS — Z483 Aftercare following surgery for neoplasm: Secondary | ICD-10-CM

## 2021-07-30 DIAGNOSIS — M25612 Stiffness of left shoulder, not elsewhere classified: Secondary | ICD-10-CM

## 2021-07-30 DIAGNOSIS — Z170 Estrogen receptor positive status [ER+]: Secondary | ICD-10-CM

## 2021-07-30 NOTE — Patient Instructions (Signed)
Over Head Pull: Narrow and Wide Grip   Cancer Rehab [phone]   On back, knees bent, feet flat, band across thighs, elbows straight but relaxed. Pull hands apart (start). Keeping elbows straight, bring arms up and over head, hands toward floor. Keep pull steady on band. Hold momentarily. Return slowly, keeping pull steady, back to start. Then do same with a wider grip on the band (past shoulder width) Repeat _10__ times. Band color __yellow____   Side Pull: Double Arm   On back, knees bent, feet flat. Arms perpendicular to body, shoulder level, elbows straight but relaxed. Pull arms out to sides, elbows straight. Resistance band comes across collarbones, hands toward floor. Hold momentarily. Slowly return to starting position. Repeat _10__ times. Band color _yellow____   Sword   On back, knees bent, feet flat, left hand on left hip, right hand above left. Pull right arm DIAGONALLY (hip to shoulder) across chest. Thumb is pointed down when by opposite hip and rotates backwards when arm is up by head. Bring right arm along head toward floor. Hold momentarily. Slowly return to starting position. Repeat _10__ times. Do with left arm. Band color _yellow_____   Shoulder Rotation: Double Arm   On back, knees bent, feet flat, elbows tucked at sides, bent 90, hands palms up. Pull hands apart and down toward floor, keeping elbows near sides. Hold momentarily. Slowly return to starting position. Repeat _10__ times. Band color __yellow____

## 2021-07-30 NOTE — Therapy (Signed)
OUTPATIENT PHYSICAL THERAPY [facility]Y [facility]   Patient Name: Alexandria Hoover MRN: [id number] DOB:04/13/1955, 66 y.o., female Today's Date: 07/30/2021   PT End of Session - 07/30/21 1109     Visit Number 3    Number of Visits 6    Date for PT Re-Evaluation 08/18/21    PT Start Time 1107    PT Stop Time 1146    PT Time Calculation (min) 39 min    Activity Tolerance Patient tolerated treatment well    Behavior During Therapy WFL for tasks assessed/performed             Past Medical History:  Diagnosis Date   Breast cancer (HCC) 2023   Diabetes (HCC)    type 2   Heart murmur    Hypercholesteremia    Hypertension    Past Surgical History:  Procedure Laterality Date   ABDOMINAL HYSTERECTOMY     BREAST BIOPSY Right 06/11/2021   BREAST EXCISIONAL BIOPSY Right over 10 yrs   BREAST LUMPECTOMY WITH RADIOACTIVE SEED AND SENTINEL LYMPH NODE BIOPSY Right 07/06/2021   Procedure: RIGHT BREAST LUMPECTOMY WITH RADIOACTIVE SEED AND AXILLARY SENTINEL LYMPH NODE BIOPSY;  Surgeon: Wakefield, Matthew, MD;  Location: [facility];  Service: General;  Laterality: Right;   TUBAL LIGATION     WISDOM TOOTH EXTRACTION     Patient Active Problem List   Diagnosis Date Noted   Genetic testing 06/30/2021   Family history of breast cancer 06/24/2021   Family history of prostate cancer 06/24/2021   Malignant neoplasm of upper-outer quadrant of right breast in female, estrogen receptor positive (HCC) 06/21/2021      REFERRING PROVIDER: Dr. Wakefield   REFERRING DIAG: Malignant neoplasm of upper-outer quadrant of right breast in female, estrogen receptor positive (HCC)  THERAPY DIAG:  Stiffness of left shoulder, not elsewhere classified  Aftercare following surgery for neoplasm  Stiffness of right shoulder, not elsewhere classified  Abnormal posture  Malignant neoplasm of upper-outer quadrant of right breast in female, estrogen receptor positive (HCC)  Rationale for  Evaluation and Treatment Rehabilitation  ONSET DATE: 04/12/2021  SUBJECTIVE:                                                                                                                                                                                           SUBJECTIVE STATEMENT: I have an appointment on Tuesday and I don't know if I will be able to make it. I have been doing some exercises.   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 It measures 1.7 cm and is located   in the upper outer quadrant. It is ER/PR positive and HER2 negative with a Ki67 of 2%. Also has hypertension and diabetes. Pt had lumpectomy with 0/3 sentinel nodes removed on 07/06/2021 . Pt will be starting radiation on 08/02/2021    PATIENT GOALS:  Reassess how my recovery is going related to arm function, pain, and swelling.  PAIN:  Are you having pain? No  PRECAUTIONS: Recent Surgery, right UE Lymphedema risk,   ACTIVITY LEVEL / LEISURE: Pt has not been walking but she has been doing the arm exercises that they gave her    OBJECTIVE:   PATIENT SURVEYS:  QUICK DASH: 22.73  OBSERVATIONS:  Pt has well healing incisions in axilla and breast with steri strips still intact  POSTURE:  Forward head, forward shoulders, increased thoracic kyphosis  LYMPHEDEMA ASSESSMENT:  UPPER EXTREMITY AROM/PROM:   A/PROM RIGHT  06/23/2021   Right 07/27/2021 R 07/30/21  Shoulder extension 48    Shoulder flexion 145 135 152  Shoulder abduction 125 120 150  Shoulder internal rotation 82    Shoulder external rotation 48                            (Blank rows = not tested)   A/PROM LEFT  06/23/2021 Left 07/27/2021  Shoulder extension 53   Shoulder flexion 125 145  Shoulder abduction 126 130  Shoulder internal rotation 70   Shoulder external rotation 56                           (Blank rows = not tested)    LYMPHEDEMA ASSESSMENTS:    LANDMARK RIGHT   06/23/2021 RIght 08/06/2021  10 cm proximal to olecranon process 35.3 36.5  Olecranon process 30.1 30.5  10 cm proximal to ulnar styloid process 26 27.3  Just proximal to ulnar styloid process 18.7 18  Across hand at thumb web space 20.4 20.8  At base of 2nd digit 6.8 6.6  (Blank rows = not tested)   LANDMARK LEFT  06/23/2021 Left 08/06/2021  10 cm proximal to olecranon process 36.8 37.5  Olecranon process 30.7 31  10 cm proximal to ulnar styloid process 26.2 25  Just proximal to ulnar styloid process 20.5 19.5  Across hand at thumb web space 20.4 20  At base of 2nd digit 6.8 6.8  (Blank rows = not tested)      Surgery type/Date: right lumpectomy  Number of lymph nodes removed: 3 Current/past treatment (chemo, radiation, hormone therapy): will start radiation soon  Other symptoms:  Heaviness/tightness No Pain No Pitting edema No Infections No Decreased scar mobility: not tested  Stemmer sign No   PATIENT EDUCATION:  Education details: lymphedema risk reduction practices Person educated: Patient Education method: Explanation and Handouts Education comprehension: verbalized understanding   HOME EXERCISE PROGRAM:  Reviewed previously given post op HEP. Upgreaded to supine stretches and simulation of radiation position Supine scapular series with yellow band x 10 reps each  TREATMENT:    07/30/21: Educated pt on lymphedema risk reduction practices and issued handout; instruct pt in supine scapular strengthening series using yellow theraband and having pt return demonstrate 10 reps of each exercises while providing moderate verbal and tactile cues for pt to perform exercises correctly.   ASSESSMENT:  CLINICAL IMPRESSION: Remeasured pt's right shoulder ROM and it has improved greatly since last week. She now has better shoulder ROM than at baseline. Educated pt in   lymphedema risk reduction practices since she is unable to attend the ABC class due to her work schedule. Began  strengthening exercises including supine scapular series and issued this as part of HEP. Talked with pt about After Breast Cancer program and pt is interested in learning this so will begin instruction at next session.   Pt will benefit from skilled therapeutic intervention to improve on the following deficits: Decreased knowledge of precautions, impaired UE functional use, pain, decreased ROM, postural dysfunction.   PT treatment/interventions: ADL/Self care home management, Therapeutic exercises, Therapeutic activity, Neuromuscular re-education, Balance training, Gait training, Patient/Family education, Joint mobilization, and Aquatic Therapy     GOALS: Goals reviewed with patient? Yes  LONG TERM GOALS:  (STG=LTG)  GOALS Name Target Date  Goal status  1 Pt will demonstrate she has regained full shoulder ROM and function post operatively compared to baselines.  Baseline: 08/13/2021 In progress  2 Pt will verbalize understanding of lymphedema risk reduction practices  08/13/2021 INITIAL  3 Pt will be independent in HEP for ongoing shoulder ROM during radiation and strength for return to work  08/13/2021 INITIAL  4        PLAN: PT FREQUENCY/DURATION: 2-4 visits   PLAN FOR NEXT SESSION:  measure Shoulder ROM, P/AROM as needed . Assess indep with supine scap series, educate pt in Strength ABC program, encourage walking program for return to work    Blaire Breedlove Blue, PT 07/30/2021, 11:52 AM  

## 2021-07-31 NOTE — Progress Notes (Signed)
Radiation Oncology         ([phone]) [phone] ________________________________  Name: Alexandria Hoover MRN: [id number]  Date: 08/02/2021  DOB: 05-22-1955  Re-Evaluation Note  CC: Maurice Small, MD  Benay Pike, MD  No diagnosis found.  Diagnosis:  S/p lumpectomy: Stage IA (cT1c, cN0, cM0) Right Breast UOQ, Invasive Ductal Carcinoma, ER+ / PR+ / Her2-, Grade 1  Narrative:  The patient returns today to discuss radiation treatment options. She was seen in the multidisciplinary breast clinic on 06/23/21.   Since consultation, she underwent genetic testing on 06/23/21. Results showed no clinically significant variants detected by BRCAplus or +RNAinsight testing.  She opted to proceed with right breast lumpectomy with SLN biopsies on 07/06/21 under the care of Dr. Donne Hazel. Pathology from the procedure revealed: grade 1 invasive well differentiated ductal carcinoma measuring 1.5 mm. All margins negative for carcinoma. Nodal status of 3/3 right axillary SLN excisions negative for carcinoma. Prognostic indicators significant for: estrogen receptor 100% positive and progesterone receptor 100% positive, both with strong staining intensity; Proliferation marker Ki67 at 2%; Her2 status negative; Grade 1.   During her visit with Dr. Chryl Heck on 06/23/21, the patient agreed to proceed with oncotype testing and antiestrogens depending on final pathology. Oncotype results are pending at this time. Depending on results, the patient will return to Dr. Chryl Heck in the near future to further discuss treatment options.   On review of systems, the patient reports ***. She denies *** and any other symptoms.    Allergies:  is allergic to codeine and lisinopril.  Meds: Current Outpatient Medications  Medication Sig Dispense Refill   amLODipine (NORVASC) 10 MG tablet Take 10 mg by mouth daily.     Cholecalciferol (VITAMIN D3) 250 MCG (10000 UT) capsule 10,000 Units daily.     loratadine (CLARITIN) 10 MG tablet Take  10 mg by mouth daily as needed for allergies.     losartan-hydrochlorothiazide (HYZAAR) 100-25 MG tablet Take 1 tablet by mouth daily.     metFORMIN (GLUCOPHAGE-XR) 500 MG 24 hr tablet 500 mg daily with breakfast.     oxyCODONE (OXY IR/ROXICODONE) 5 MG immediate release tablet Take 1 tablet (5 mg total) by mouth every 6 (six) hours as needed. (Patient not taking: Reported on 07/27/2021) 10 tablet 0   pravastatin (PRAVACHOL) 40 MG tablet 1 tablet     No current facility-administered medications for this encounter.    Physical Findings: The patient is in no acute distress. Patient is alert and oriented.  vitals were not taken for this visit.  No significant changes. Lungs are clear to auscultation bilaterally. Heart has regular rate and rhythm. No palpable cervical, supraclavicular, or axillary adenopathy. Abdomen soft, non-tender, normal bowel sounds. Left Breast: no palpable mass, nipple discharge or bleeding. Right Breast: ***  Lab Findings: Lab Results  Component Value Date   WBC 7.0 06/23/2021   HGB 12.3 06/23/2021   HCT 39.5 06/23/2021   MCV 77.3 (L) 06/23/2021   PLT 361 06/23/2021    Radiographic Findings: MM Breast Surgical Specimen  Result Date: 07/06/2021 CLINICAL DATA:  Evaluate specimen EXAM: SPECIMEN RADIOGRAPH OF THE RIGHT BREAST COMPARISON:  None Available. FINDINGS: Status post excision of the right breast. The radioactive seed and biopsy marker clip are present, completely intact, and were marked for pathology. IMPRESSION: Specimen radiograph of the right breast. Electronically Signed   By: Dorise Bullion III M.D.   On: 07/06/2021 13:44  MM RT RADIOACTIVE SEED LOC MAMMO GUIDE  Result Date: 07/05/2021 CLINICAL DATA:  66 year old female presenting for radioactive seed localization of the right breast prior to lumpectomy. EXAM: MAMMOGRAPHIC GUIDED RADIOACTIVE SEED LOCALIZATION OF THE RIGHT BREAST COMPARISON:  None Available. FINDINGS: Patient presents for radioactive seed  localization prior to right breast lumpectomy. I met with the patient and we discussed the procedure of seed localization including benefits and alternatives. We discussed the high likelihood of a successful procedure. We discussed the risks of the procedure including infection, bleeding, tissue injury and further surgery. We discussed the low dose of radioactivity involved in the procedure. Informed, written consent was given. The usual time-out protocol was performed immediately prior to the procedure. Using mammographic guidance, sterile technique, 1% lidocaine and an I-125 radioactive seed, the biopsy marking clip in the upper-outer right breast was localized using a lateral approach. The follow-up mammogram images confirm the seed in the expected location and were marked for Dr. Donne Hazel. Follow-up survey of the patient confirms presence of the radioactive seed. Order number of I-125 seed:  [id number]. Total activity:  8.786 millicuries reference Date: 06/06/2021 The patient tolerated the procedure well and was released from the [facility]. She was given instructions regarding seed removal. IMPRESSION: Radioactive seed localization right breast. Due to the presence of a hematoma, the seed was deployed along the medial margin of the hematoma, approximately 1 cm medial to the ribbon shaped biopsy marking clip. No apparent complications. Electronically Signed   By: Ammie Ferrier M.D.   On: 07/05/2021 15:32   Impression: S/p lumpectomy: Stage IA (cT1c, cN0, cM0) Right Breast UOQ, Invasive Ductal Carcinoma, ER+ / PR+ / Her2-, Grade 1  ***  Plan:  Patient is scheduled for CT simulation {date/later today}. ***  -----------------------------------  Blair Promise, PhD, MD  This document serves as a record of services personally performed by Gery Pray, MD. It was created on his behalf by Roney Mans, a trained medical scribe. The creation of this record is based on the scribe's personal  observations and the provider's statements to them. This document has been checked and approved by the attending provider.

## 2021-08-02 ENCOUNTER — Other Ambulatory Visit: Payer: Self-pay

## 2021-08-02 ENCOUNTER — Encounter: Payer: Self-pay | Admitting: Radiation Oncology

## 2021-08-02 ENCOUNTER — Ambulatory Visit
Admission: RE | Admit: 2021-08-02 | Discharge: 2021-08-02 | Disposition: A | Discharge status: Home / Self Care | Payer: Medicare Other | Source: Ambulatory Visit | Attending: Radiation Oncology | Admitting: Radiation Oncology

## 2021-08-02 ENCOUNTER — Encounter: Payer: Self-pay | Admitting: Oncology

## 2021-08-02 VITALS — BP 169/81 | HR 79 | Temp 97.0°F | Resp 18 | Ht 64.0 in

## 2021-08-02 DIAGNOSIS — Z170 Estrogen receptor positive status [ER+]: Secondary | ICD-10-CM | POA: Insufficient documentation

## 2021-08-02 DIAGNOSIS — Z7984 Long term (current) use of oral hypoglycemic drugs: Secondary | ICD-10-CM | POA: Diagnosis not present

## 2021-08-02 DIAGNOSIS — Z79899 Other long term (current) drug therapy: Secondary | ICD-10-CM | POA: Insufficient documentation

## 2021-08-02 DIAGNOSIS — C50411 Malignant neoplasm of upper-outer quadrant of right female breast: Secondary | ICD-10-CM | POA: Insufficient documentation

## 2021-08-03 ENCOUNTER — Ambulatory Visit: Payer: Medicare Other | Admitting: Physical Therapy

## 2021-08-03 ENCOUNTER — Encounter: Payer: Self-pay | Admitting: *Deleted

## 2021-08-03 ENCOUNTER — Encounter: Payer: Self-pay | Admitting: Physical Therapy

## 2021-08-03 DIAGNOSIS — C50411 Malignant neoplasm of upper-outer quadrant of right female breast: Secondary | ICD-10-CM | POA: Diagnosis not present

## 2021-08-03 DIAGNOSIS — R293 Abnormal posture: Secondary | ICD-10-CM

## 2021-08-03 DIAGNOSIS — M25612 Stiffness of left shoulder, not elsewhere classified: Secondary | ICD-10-CM

## 2021-08-03 DIAGNOSIS — Z483 Aftercare following surgery for neoplasm: Secondary | ICD-10-CM

## 2021-08-03 DIAGNOSIS — M25611 Stiffness of right shoulder, not elsewhere classified: Secondary | ICD-10-CM

## 2021-08-03 NOTE — Therapy (Signed)
OUTPATIENT PHYSICAL THERAPY ONCOLOGY [facility]   Patient Name: Alexandria Hoover MRN: [id number] DOB:11/05/1955, 66 y.o., female Today's Date: 08/03/2021   PT End of Session - 08/03/21 0951     Visit Number 4    Number of Visits 6    Date for PT Re-Evaluation 08/18/21    PT Start Time 0900    PT Stop Time 0945    PT Time Calculation (min) 45 min    Activity Tolerance Patient tolerated treatment well    Behavior During Therapy WFL for tasks assessed/performed             Past Medical History:  Diagnosis Date   Breast cancer (HCC) 2023   Diabetes (HCC)    type 2   Heart murmur    Hypercholesteremia    Hypertension    Past Surgical History:  Procedure Laterality Date   ABDOMINAL HYSTERECTOMY     BREAST BIOPSY Right 06/11/2021   BREAST EXCISIONAL BIOPSY Right over 10 yrs   BREAST LUMPECTOMY WITH RADIOACTIVE SEED AND SENTINEL LYMPH NODE BIOPSY Right 07/06/2021   Procedure: RIGHT BREAST LUMPECTOMY WITH RADIOACTIVE SEED AND AXILLARY SENTINEL LYMPH NODE BIOPSY;  Surgeon: Wakefield, Matthew, MD;  Location: [facility];  Service: General;  Laterality: Right;   TUBAL LIGATION     WISDOM TOOTH EXTRACTION     Patient Active Problem List   Diagnosis Date Noted   Genetic testing 06/30/2021   Family history of breast cancer 06/24/2021   Family history of prostate cancer 06/24/2021   Malignant neoplasm of upper-outer quadrant of right breast in female, estrogen receptor positive (HCC) 06/21/2021      REFERRING PROVIDER: Dr. Wakefield   REFERRING DIAG: Malignant neoplasm of upper-outer quadrant of right breast in female, estrogen receptor positive (HCC)  THERAPY DIAG:  Stiffness of left shoulder, not elsewhere classified  Aftercare following surgery for neoplasm  Stiffness of right shoulder, not elsewhere classified  Malignant neoplasm of upper-outer quadrant of right breast in female, estrogen receptor positive (HCC)  Abnormal posture  Rationale for  Evaluation and Treatment Rehabilitation  ONSET DATE: 04/12/2021  SUBJECTIVE:                                                                                                                                                                                           SUBJECTIVE STATEMENT: Pt states that work is going ok  She has been been doing the yellow theraband exercises on her back 10 repetitions without difficulty.  She has no increased pain or swelling, but does feel stretching.  She starts radiation July 3 .     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 It measures 1.7 cm and is located in the upper outer quadrant. It is ER/PR positive and HER2 negative with a Ki67 of 2%. Also has hypertension and diabetes. Pt had lumpectomy with 0/3 sentinel nodes removed on 07/06/2021 . Pt will be starting radiation on 08/02/2021    PATIENT GOALS:  to get my arm back to normal  PAIN:  Are you having pain? No  PRECAUTIONS: Recent Surgery, right UE Lymphedema risk,   ACTIVITY LEVEL / LEISURE: Pt has not been walking but she has been doing the arm exercises with theraband   OBJECTIVE:   PATIENT SURVEYS:  QUICK DASH: 22.73  OBSERVATIONS:  Pt has well healing incisions in axilla and breast with steri strips still intact  POSTURE:  Forward head, forward shoulders, increased thoracic kyphosis  LYMPHEDEMA ASSESSMENT:  UPPER EXTREMITY AROM/PROM:   A/PROM RIGHT  06/23/2021   Right 07/27/2021 R 07/30/21 RIGHT 08/03/2021  Shoulder extension 48     Shoulder flexion 145 135 152 152  Shoulder abduction 125 120 150 152  Shoulder internal rotation 82     Shoulder external rotation 48                             (Blank rows = not tested)   A/PROM LEFT  06/23/2021 Left 07/27/2021  Shoulder extension 53   Shoulder flexion 125 145  Shoulder abduction 126 130  Shoulder internal rotation 70   Shoulder external rotation 56                            (Blank rows = not tested)    LYMPHEDEMA ASSESSMENTS:    LANDMARK RIGHT  06/23/2021 RIght 07/27/2021  10 cm proximal to olecranon process 35.3 36.5  Olecranon process 30.1 30.5  10 cm proximal to ulnar styloid process 26 27.3  Just proximal to ulnar styloid process 18.7 18  Across hand at thumb web space 20.4 20.8  At base of 2nd digit 6.8 6.6  (Blank rows = not tested)   LANDMARK LEFT  06/23/2021 Left 07/27/2021  10 cm proximal to olecranon process 36.8 37.5  Olecranon process 30._0 cm proximal to ulnar styloid process 26.2 25  Just proximal to ulnar styloid process 20.5 19.5  Across hand at thumb web space 20.4 20  At base of 2nd digit 6.8 6.8  (Blank rows = not tested)      Surgery type/Date: right lumpectomy  Number of lymph nodes removed: 3 Current/past treatment (chemo, radiation, hormone therapy): will start radiation soon  Other symptoms:  Heaviness/tightness No Pain No Pitting edema No Infections No Decreased scar mobility: not tested  Stemmer sign No   TREATMENT TODAY:   upgraded supine scapular series to red theraband backing down to 5 reps and instructed to add one per day til she gets to 10 . Instructed and practiced and delivered [place] program to email and text for squats. Back to wall overhead arm, bicep curls with red theraband  and one legged dead lift with arm support all 5 reps Pt able to perform without difficulty.  Reviewed lymphedema risk reduction practices especially protection of right arm from injury, burns, cuts and bug bites and pt acknowledged understanding.   PATIENT EDUCATION:  Education details: lymphedema risk reduction practices and upgraded home exericse  Person educated: Patient Education method:  Explanation and Handouts emailed and texted [place]  Education comprehension: verbalized understanding   HOME EXERCISE PROGRAM:  Reviewed previously given post op HEP. Upgraded to supine stretches and simulation of  radiation position Supine scapular series with red band 5 reps each and progress one per day til 10 reps  ADD WALKING PROGRAM   Access Code: [facility] URL: [email or web address] Date: 08/03/2021 Prepared by: Maudry Diego  Program Notes can progress to light weights as you feel able. Start with something like canned goods from your pantry   Exercises - Squat with Chair Touch  - 1 x daily - 7 x weekly - 1-3 sets - 10 reps - Standing Overhead Press at [employer]  - 1 x daily - 7 x weekly - 1-3 sets - 10 reps - Forward T with Counter Support  - 1 x daily - 7 x weekly - 3 sets - 10 reps - Standing Bicep Curls with Resistance  - 1 x daily - 7 x weekly - 3 sets - 10 reps     07/30/21: Educated pt on lymphedema risk reduction practices and issued handout; instruct pt in supine scapular strengthening series using yellow theraband and having pt return demonstrate 10 reps of each exercises while providing moderate verbal and tactile cues for pt to perform exercises correctly.   ASSESSMENT:  CLINICAL IMPRESSION: Remeasured pt's right shoulder ROM  Reviewed and reinforced  lymphedema risk reduction. Upgraded  strengthening exercises including supine scapular series and [place] program for general strengthening  Pt is doing very well, is back at work and preparing to start radiation next week. She does not need further skilled PT at this point.  She will continue her exercise at home and will come back for reassessment and discharge after radiation is complete.  She knows she will continue her SOZO screening every 3 months.      Pt will benefit from skilled therapeutic intervention to improve on the following deficits: Decreased knowledge of precautions, impaired UE functional use, pain, decreased ROM, postural dysfunction.   PT treatment/interventions: ADL/Self care home management, Therapeutic exercises, Therapeutic activity, Neuromuscular re-education, Balance training, Gait training,  Patient/Family education, Joint mobilization, and Aquatic Therapy     GOALS: Goals reviewed with patient? Yes  LONG TERM GOALS:  (STG=LTG)  GOALS Name Target Date  Goal status  1 Pt will demonstrate she has regained full shoulder ROM and function post operatively compared to baselines.  Baseline: 08/17/2021 MET  2 Pt will verbalize understanding of lymphedema risk reduction practices  08/17/2021 MET  3 Pt will be independent in HEP for ongoing shoulder ROM during radiation and strength for return to work  08/17/2021 MET  4        PLAN: PT FREQUENCY/DURATION: 2-4 visits   PLAN FOR NEXT SESSION:  Reassess after radiation and discharge if appropriate at that time from PT. Continue 3 month SOZO screening.  Donato Heinz. Owens Shark PT  Norwood Levo, PT 08/03/2021, 9:52 AM

## 2021-08-05 ENCOUNTER — Inpatient Hospital Stay: Payer: Commercial Managed Care - HMO | Attending: Hematology and Oncology | Admitting: Hematology and Oncology

## 2021-08-05 ENCOUNTER — Encounter: Payer: Self-pay | Admitting: Hematology and Oncology

## 2021-08-05 ENCOUNTER — Other Ambulatory Visit: Payer: Self-pay

## 2021-08-05 DIAGNOSIS — Z170 Estrogen receptor positive status [ER+]: Secondary | ICD-10-CM | POA: Insufficient documentation

## 2021-08-05 DIAGNOSIS — Z888 Allergy status to other drugs, medicaments and biological substances status: Secondary | ICD-10-CM | POA: Diagnosis not present

## 2021-08-05 DIAGNOSIS — C50411 Malignant neoplasm of upper-outer quadrant of right female breast: Secondary | ICD-10-CM | POA: Insufficient documentation

## 2021-08-05 DIAGNOSIS — Z8042 Family history of malignant neoplasm of prostate: Secondary | ICD-10-CM | POA: Diagnosis not present

## 2021-08-05 DIAGNOSIS — Z803 Family history of malignant neoplasm of breast: Secondary | ICD-10-CM | POA: Diagnosis not present

## 2021-08-05 DIAGNOSIS — Z809 Family history of malignant neoplasm, unspecified: Secondary | ICD-10-CM | POA: Insufficient documentation

## 2021-08-05 DIAGNOSIS — Z5986 Financial insecurity: Secondary | ICD-10-CM | POA: Diagnosis not present

## 2021-08-05 DIAGNOSIS — Z885 Allergy status to narcotic agent status: Secondary | ICD-10-CM | POA: Insufficient documentation

## 2021-08-05 DIAGNOSIS — Z79899 Other long term (current) drug therapy: Secondary | ICD-10-CM | POA: Insufficient documentation

## 2021-08-05 DIAGNOSIS — Z800 Family history of malignant neoplasm of digestive organs: Secondary | ICD-10-CM | POA: Diagnosis not present

## 2021-08-05 NOTE — Assessment & Plan Note (Addendum)
This is a very pleasant 66 year old postmenopausal female patient with newly diagnosed right breast invasive ductal carcinoma, grade 1 ER/PR positive, KI of 2%, HER2 FISH pending referred to breast [place] for recommendations.  Given small tumor, architectural distortion measuring about 1.7 cm, strong ER/PR positivity, HER2 FISH negative, she underwent lumpectomy upfront.  This showed final pathology with 1.5 mm grade 1 IDC, negative margins, all lymph nodes benign. Given tumor and a 5 mm, I have not recommended any Oncotype testing.  We have agreed to proceed with adjuvant radiation followed by antiestrogen therapy.  I once again discussed about tamoxifen versus aromatase inhibitors today.  Patient prefers aromatase inhibitors for now.  She cannot remember having any underlying bone density issues.  We will see her after she completes radiation and initiate anastrozole.

## 2021-08-13 ENCOUNTER — Ambulatory Visit: Payer: Commercial Managed Care - HMO | Admitting: Hematology and Oncology

## 2021-08-16 ENCOUNTER — Other Ambulatory Visit: Payer: Self-pay

## 2021-08-16 ENCOUNTER — Ambulatory Visit
Admission: RE | Admit: 2021-08-16 | Discharge: 2021-08-16 | Disposition: A | Discharge status: Home / Self Care | Payer: Medicare Other | Source: Ambulatory Visit | Attending: Radiation Oncology | Admitting: Radiation Oncology

## 2021-08-16 ENCOUNTER — Ambulatory Visit: Payer: Medicare Other

## 2021-08-16 DIAGNOSIS — C50411 Malignant neoplasm of upper-outer quadrant of right female breast: Secondary | ICD-10-CM | POA: Insufficient documentation

## 2021-08-16 DIAGNOSIS — Z170 Estrogen receptor positive status [ER+]: Secondary | ICD-10-CM | POA: Insufficient documentation

## 2021-08-16 LAB — RAD ONC ARIA SESSION SUMMARY
Course Elapsed Days: 0
Plan Fractions Treated to Date: 1
Plan Prescribed Dose Per Fraction: 2.67 Gy
Plan Total Fractions Prescribed: 16
Plan Total Prescribed Dose: 42.72 Gy
Reference Point Dosage Given to Date: 2.67 Gy
Reference Point Session Dosage Given: 2.67 Gy
Session Number: 1

## 2021-08-16 MED ORDER — RADIAPLEXRX EX GEL: Freq: Once | CUTANEOUS | Status: AC

## 2021-08-16 MED ORDER — ALRA NON-METALLIC DEODORANT (RAD-ONC)
1.0000 | Freq: Once | TOPICAL | Status: AC
  Administered 2021-08-16: 1 via TOPICAL

## 2021-08-16 NOTE — Progress Notes (Signed)
Pt here for patient teaching.    Pt given Radiation and You booklet, skin care instructions, Alra deodorant, and Radiaplex gel.    Reviewed areas of pertinence such as fatigue, hair loss in treatment field, skin changes, breast tenderness, and breast swelling .   Pt able to give teach back of to pat skin and use unscented/gentle soap,apply Radiaplex bid, apply Sonafine bid, avoid applying anything to skin within 4 hours of treatment, avoid wearing an under wire bra, and to use an electric razor if they must shave.   Pt demonstrated understanding and verbalizes understanding of information given and will contact nursing with any questions or concerns.    [email or web address]

## 2021-08-18 ENCOUNTER — Other Ambulatory Visit: Payer: Self-pay

## 2021-08-18 ENCOUNTER — Ambulatory Visit
Admission: RE | Admit: 2021-08-18 | Discharge: 2021-08-18 | Disposition: A | Discharge status: Home / Self Care | Payer: Medicare Other | Source: Ambulatory Visit | Attending: Radiation Oncology | Admitting: Radiation Oncology

## 2021-08-18 DIAGNOSIS — C50411 Malignant neoplasm of upper-outer quadrant of right female breast: Secondary | ICD-10-CM | POA: Diagnosis not present

## 2021-08-18 LAB — RAD ONC ARIA SESSION SUMMARY
Course Elapsed Days: 2
Plan Fractions Treated to Date: 2
Plan Prescribed Dose Per Fraction: 2.67 Gy
Plan Total Fractions Prescribed: 16
Plan Total Prescribed Dose: 42.72 Gy
Reference Point Dosage Given to Date: 5.34 Gy
Reference Point Session Dosage Given: 2.67 Gy
Session Number: 2

## 2021-08-19 ENCOUNTER — Other Ambulatory Visit: Payer: Self-pay

## 2021-08-19 ENCOUNTER — Ambulatory Visit
Admission: RE | Admit: 2021-08-19 | Discharge: 2021-08-19 | Disposition: A | Discharge status: Home / Self Care | Payer: Medicare Other | Source: Ambulatory Visit | Attending: Radiation Oncology | Admitting: Radiation Oncology

## 2021-08-19 DIAGNOSIS — C50411 Malignant neoplasm of upper-outer quadrant of right female breast: Secondary | ICD-10-CM | POA: Diagnosis not present

## 2021-08-19 LAB — RAD ONC ARIA SESSION SUMMARY
Course Elapsed Days: 3
Plan Fractions Treated to Date: 3
Plan Prescribed Dose Per Fraction: 2.67 Gy
Plan Total Fractions Prescribed: 16
Plan Total Prescribed Dose: 42.72 Gy
Reference Point Dosage Given to Date: 8.01 Gy
Reference Point Session Dosage Given: 2.67 Gy
Session Number: 3

## 2021-08-20 ENCOUNTER — Ambulatory Visit
Admission: RE | Admit: 2021-08-20 | Discharge: 2021-08-20 | Disposition: A | Discharge status: Home / Self Care | Payer: Medicare Other | Source: Ambulatory Visit | Attending: Radiation Oncology | Admitting: Radiation Oncology

## 2021-08-20 ENCOUNTER — Other Ambulatory Visit: Payer: Self-pay

## 2021-08-20 DIAGNOSIS — C50411 Malignant neoplasm of upper-outer quadrant of right female breast: Secondary | ICD-10-CM | POA: Diagnosis not present

## 2021-08-20 LAB — RAD ONC ARIA SESSION SUMMARY
Course Elapsed Days: 4
Plan Fractions Treated to Date: 4
Plan Prescribed Dose Per Fraction: 2.67 Gy
Plan Total Fractions Prescribed: 16
Plan Total Prescribed Dose: 42.72 Gy
Reference Point Dosage Given to Date: 10.68 Gy
Reference Point Session Dosage Given: 2.67 Gy
Session Number: 4

## 2021-08-23 ENCOUNTER — Ambulatory Visit: Payer: Medicare Other

## 2021-08-24 ENCOUNTER — Other Ambulatory Visit: Payer: Self-pay

## 2021-08-24 ENCOUNTER — Ambulatory Visit: Payer: Medicare Other

## 2021-08-24 ENCOUNTER — Ambulatory Visit
Admission: RE | Admit: 2021-08-24 | Discharge: 2021-08-24 | Disposition: A | Discharge status: Home / Self Care | Payer: Medicare Other | Source: Ambulatory Visit | Attending: Radiation Oncology | Admitting: Radiation Oncology

## 2021-08-24 DIAGNOSIS — C50411 Malignant neoplasm of upper-outer quadrant of right female breast: Secondary | ICD-10-CM | POA: Diagnosis not present

## 2021-08-24 LAB — RAD ONC ARIA SESSION SUMMARY
Course Elapsed Days: 8
Plan Fractions Treated to Date: 5
Plan Prescribed Dose Per Fraction: 2.67 Gy
Plan Total Fractions Prescribed: 16
Plan Total Prescribed Dose: 42.72 Gy
Reference Point Dosage Given to Date: 13.35 Gy
Reference Point Session Dosage Given: 2.67 Gy
Session Number: 5

## 2021-08-25 ENCOUNTER — Other Ambulatory Visit: Payer: Self-pay

## 2021-08-25 ENCOUNTER — Ambulatory Visit: Payer: Medicare Other

## 2021-08-25 ENCOUNTER — Ambulatory Visit
Admission: RE | Admit: 2021-08-25 | Discharge: 2021-08-25 | Disposition: A | Discharge status: Home / Self Care | Payer: Medicare Other | Source: Ambulatory Visit | Attending: Radiation Oncology | Admitting: Radiation Oncology

## 2021-08-25 DIAGNOSIS — C50411 Malignant neoplasm of upper-outer quadrant of right female breast: Secondary | ICD-10-CM | POA: Diagnosis not present

## 2021-08-25 LAB — RAD ONC ARIA SESSION SUMMARY
Course Elapsed Days: 9
Plan Fractions Treated to Date: 6
Plan Prescribed Dose Per Fraction: 2.67 Gy
Plan Total Fractions Prescribed: 16
Plan Total Prescribed Dose: 42.72 Gy
Reference Point Dosage Given to Date: 16.02 Gy
Reference Point Session Dosage Given: 2.67 Gy
Session Number: 6

## 2021-08-26 ENCOUNTER — Ambulatory Visit
Admission: RE | Admit: 2021-08-26 | Discharge: 2021-08-26 | Disposition: A | Discharge status: Home / Self Care | Payer: Medicare Other | Source: Ambulatory Visit | Attending: Radiation Oncology | Admitting: Radiation Oncology

## 2021-08-26 ENCOUNTER — Telehealth: Payer: Self-pay | Admitting: Licensed Clinical Social Worker

## 2021-08-26 ENCOUNTER — Other Ambulatory Visit: Payer: Self-pay

## 2021-08-26 DIAGNOSIS — C50411 Malignant neoplasm of upper-outer quadrant of right female breast: Secondary | ICD-10-CM | POA: Diagnosis not present

## 2021-08-26 LAB — RAD ONC ARIA SESSION SUMMARY
Course Elapsed Days: 10
Plan Fractions Treated to Date: 7
Plan Prescribed Dose Per Fraction: 2.67 Gy
Plan Total Fractions Prescribed: 16
Plan Total Prescribed Dose: 42.72 Gy
Reference Point Dosage Given to Date: 18.69 Gy
Reference Point Session Dosage Given: 2.67 Gy
Session Number: 7

## 2021-08-26 NOTE — Telephone Encounter (Signed)
[place] Work  Clinical Social Work was referred by Art therapist for assessment of psychosocial needs.  Clinical Social Worker attempted to contact patient by phone  to offer support and assess for needs.   No answer on home or cell numbers. Left VM with direct contact information.  CSW previously shared information on cancer foundations that may be able to provide financial support to patient.      Alexandria Flanary E Tharon Bomar, LCSW  Clinical Social Worker [place]        Patient is participating in a Managed Medicaid Plan:  Yes

## 2021-08-27 ENCOUNTER — Ambulatory Visit
Admission: RE | Admit: 2021-08-27 | Discharge: 2021-08-27 | Disposition: A | Discharge status: Home / Self Care | Payer: Medicare Other | Source: Ambulatory Visit | Attending: Radiation Oncology | Admitting: Radiation Oncology

## 2021-08-27 ENCOUNTER — Other Ambulatory Visit: Payer: Self-pay

## 2021-08-27 ENCOUNTER — Telehealth: Payer: Self-pay | Admitting: Licensed Clinical Social Worker

## 2021-08-27 DIAGNOSIS — C50411 Malignant neoplasm of upper-outer quadrant of right female breast: Secondary | ICD-10-CM | POA: Diagnosis not present

## 2021-08-27 LAB — RAD ONC ARIA SESSION SUMMARY
Course Elapsed Days: 11
Plan Fractions Treated to Date: 8
Plan Prescribed Dose Per Fraction: 2.67 Gy
Plan Total Fractions Prescribed: 16
Plan Total Prescribed Dose: 42.72 Gy
Reference Point Dosage Given to Date: 21.36 Gy
Reference Point Session Dosage Given: 2.67 Gy
Session Number: 8

## 2021-08-27 NOTE — Telephone Encounter (Signed)
[place] Work  Received VM from pt returning my call regarding assistance. Patient requested an e-mail with the information. E-mail sent with applications for foundations ([employer], [employer] [place]) which may be able to provide financial assistance.   Chrisy Hillebrand E Cherl Gorney, LCSW

## 2021-08-30 ENCOUNTER — Encounter: Payer: Self-pay | Admitting: Licensed Clinical Social Worker

## 2021-08-30 ENCOUNTER — Ambulatory Visit
Admission: RE | Admit: 2021-08-30 | Discharge: 2021-08-30 | Disposition: A | Discharge status: Home / Self Care | Payer: Medicare Other | Source: Ambulatory Visit | Attending: Radiation Oncology | Admitting: Radiation Oncology

## 2021-08-30 ENCOUNTER — Other Ambulatory Visit: Payer: Self-pay

## 2021-08-30 DIAGNOSIS — C50411 Malignant neoplasm of upper-outer quadrant of right female breast: Secondary | ICD-10-CM | POA: Diagnosis not present

## 2021-08-30 LAB — RAD ONC ARIA SESSION SUMMARY
Course Elapsed Days: 14
Plan Fractions Treated to Date: 9
Plan Prescribed Dose Per Fraction: 2.67 Gy
Plan Total Fractions Prescribed: 16
Plan Total Prescribed Dose: 42.72 Gy
Reference Point Dosage Given to Date: 24.03 Gy
Reference Point Session Dosage Given: 2.67 Gy
Session Number: 9

## 2021-08-30 NOTE — Progress Notes (Signed)
[place] CSW Progress Note  Patient dropped off forms for CSW to complete for financial assistance applications. CSW completed and e-mailed back to patient. Left hard copies at front desk for pt to pick-up during her next appt.    Jobanny Mavis E Katrinia Straker, LCSW    Patient is participating in a Managed Medicaid Plan:  Yes

## 2021-08-31 ENCOUNTER — Other Ambulatory Visit: Payer: Self-pay

## 2021-08-31 ENCOUNTER — Ambulatory Visit: Payer: Medicare Other

## 2021-08-31 ENCOUNTER — Ambulatory Visit
Admission: RE | Admit: 2021-08-31 | Discharge: 2021-08-31 | Disposition: A | Discharge status: Home / Self Care | Payer: Medicare Other | Source: Ambulatory Visit | Attending: Radiation Oncology | Admitting: Radiation Oncology

## 2021-08-31 DIAGNOSIS — C50411 Malignant neoplasm of upper-outer quadrant of right female breast: Secondary | ICD-10-CM | POA: Diagnosis not present

## 2021-08-31 DIAGNOSIS — Z170 Estrogen receptor positive status [ER+]: Secondary | ICD-10-CM

## 2021-08-31 LAB — RAD ONC ARIA SESSION SUMMARY
Course Elapsed Days: 15
Plan Fractions Treated to Date: 10
Plan Prescribed Dose Per Fraction: 2.67 Gy
Plan Total Fractions Prescribed: 16
Plan Total Prescribed Dose: 42.72 Gy
Reference Point Dosage Given to Date: 26.7 Gy
Reference Point Session Dosage Given: 2.67 Gy
Session Number: 10

## 2021-08-31 MED ORDER — RADIAPLEXRX EX GEL: Freq: Once | CUTANEOUS | Status: AC

## 2021-09-01 ENCOUNTER — Other Ambulatory Visit: Payer: Self-pay

## 2021-09-01 ENCOUNTER — Ambulatory Visit
Admission: RE | Admit: 2021-09-01 | Discharge: 2021-09-01 | Disposition: A | Discharge status: Home / Self Care | Payer: Medicare Other | Source: Ambulatory Visit | Attending: Radiation Oncology | Admitting: Radiation Oncology

## 2021-09-01 DIAGNOSIS — C50411 Malignant neoplasm of upper-outer quadrant of right female breast: Secondary | ICD-10-CM | POA: Diagnosis not present

## 2021-09-01 LAB — RAD ONC ARIA SESSION SUMMARY
Course Elapsed Days: 16
Plan Fractions Treated to Date: 11
Plan Prescribed Dose Per Fraction: 2.67 Gy
Plan Total Fractions Prescribed: 16
Plan Total Prescribed Dose: 42.72 Gy
Reference Point Dosage Given to Date: 29.37 Gy
Reference Point Session Dosage Given: 2.67 Gy
Session Number: 11

## 2021-09-02 ENCOUNTER — Ambulatory Visit
Admission: RE | Admit: 2021-09-02 | Discharge: 2021-09-02 | Disposition: A | Discharge status: Home / Self Care | Payer: Medicare Other | Source: Ambulatory Visit | Attending: Radiation Oncology | Admitting: Radiation Oncology

## 2021-09-02 ENCOUNTER — Other Ambulatory Visit: Payer: Self-pay

## 2021-09-02 ENCOUNTER — Inpatient Hospital Stay: Payer: Commercial Managed Care - HMO | Attending: Hematology and Oncology | Admitting: Licensed Clinical Social Worker

## 2021-09-02 DIAGNOSIS — C50411 Malignant neoplasm of upper-outer quadrant of right female breast: Secondary | ICD-10-CM | POA: Diagnosis not present

## 2021-09-02 LAB — RAD ONC ARIA SESSION SUMMARY
Course Elapsed Days: 17
Plan Fractions Treated to Date: 12
Plan Prescribed Dose Per Fraction: 2.67 Gy
Plan Total Fractions Prescribed: 16
Plan Total Prescribed Dose: 42.72 Gy
Reference Point Dosage Given to Date: 32.04 Gy
Reference Point Session Dosage Given: 2.67 Gy
Session Number: 12

## 2021-09-02 NOTE — Progress Notes (Signed)
[place] CSW Progress Note  Patient brought in completed [employer] [place] application. Submitted via fax to [employer]. They will contact patient directly regarding decision on assistance.    Manuelita Moxon E Clayborn Milnes, LCSW    Patient is participating in a Managed Medicaid Plan:  Yes

## 2021-09-03 ENCOUNTER — Other Ambulatory Visit: Payer: Self-pay

## 2021-09-03 ENCOUNTER — Ambulatory Visit
Admission: RE | Admit: 2021-09-03 | Discharge: 2021-09-03 | Disposition: A | Discharge status: Home / Self Care | Payer: Medicare Other | Source: Ambulatory Visit | Attending: Radiation Oncology | Admitting: Radiation Oncology

## 2021-09-03 DIAGNOSIS — C50411 Malignant neoplasm of upper-outer quadrant of right female breast: Secondary | ICD-10-CM | POA: Diagnosis not present

## 2021-09-03 LAB — RAD ONC ARIA SESSION SUMMARY
Course Elapsed Days: 18
Plan Fractions Treated to Date: 13
Plan Prescribed Dose Per Fraction: 2.67 Gy
Plan Total Fractions Prescribed: 16
Plan Total Prescribed Dose: 42.72 Gy
Reference Point Dosage Given to Date: 34.71 Gy
Reference Point Session Dosage Given: 2.67 Gy
Session Number: 13

## 2021-09-06 ENCOUNTER — Encounter: Payer: Self-pay | Admitting: *Deleted

## 2021-09-06 ENCOUNTER — Other Ambulatory Visit: Payer: Self-pay

## 2021-09-06 ENCOUNTER — Ambulatory Visit
Admission: RE | Admit: 2021-09-06 | Discharge: 2021-09-06 | Disposition: A | Discharge status: Home / Self Care | Payer: Medicare Other | Source: Ambulatory Visit | Attending: Radiation Oncology | Admitting: Radiation Oncology

## 2021-09-06 DIAGNOSIS — C50411 Malignant neoplasm of upper-outer quadrant of right female breast: Secondary | ICD-10-CM | POA: Diagnosis not present

## 2021-09-06 DIAGNOSIS — Z170 Estrogen receptor positive status [ER+]: Secondary | ICD-10-CM

## 2021-09-06 LAB — RAD ONC ARIA SESSION SUMMARY
Course Elapsed Days: 21
Plan Fractions Treated to Date: 14
Plan Prescribed Dose Per Fraction: 2.67 Gy
Plan Total Fractions Prescribed: 16
Plan Total Prescribed Dose: 42.72 Gy
Reference Point Dosage Given to Date: 37.38 Gy
Reference Point Session Dosage Given: 2.67 Gy
Session Number: 14

## 2021-09-07 ENCOUNTER — Ambulatory Visit
Admission: RE | Admit: 2021-09-07 | Discharge: 2021-09-07 | Disposition: A | Discharge status: Home / Self Care | Payer: Medicare Other | Source: Ambulatory Visit | Attending: Radiation Oncology | Admitting: Radiation Oncology

## 2021-09-07 ENCOUNTER — Other Ambulatory Visit: Payer: Self-pay

## 2021-09-07 ENCOUNTER — Ambulatory Visit: Payer: Medicare Other

## 2021-09-07 DIAGNOSIS — C50411 Malignant neoplasm of upper-outer quadrant of right female breast: Secondary | ICD-10-CM | POA: Diagnosis not present

## 2021-09-07 LAB — RAD ONC ARIA SESSION SUMMARY
Course Elapsed Days: 22
Plan Fractions Treated to Date: 15
Plan Prescribed Dose Per Fraction: 2.67 Gy
Plan Total Fractions Prescribed: 16
Plan Total Prescribed Dose: 42.72 Gy
Reference Point Dosage Given to Date: 40.05 Gy
Reference Point Session Dosage Given: 2.67 Gy
Session Number: 15

## 2021-09-08 ENCOUNTER — Other Ambulatory Visit: Payer: Self-pay

## 2021-09-08 ENCOUNTER — Encounter: Payer: Self-pay | Admitting: Radiation Oncology

## 2021-09-08 ENCOUNTER — Ambulatory Visit
Admission: RE | Admit: 2021-09-08 | Discharge: 2021-09-08 | Disposition: A | Discharge status: Home / Self Care | Payer: Medicare Other | Source: Ambulatory Visit | Attending: Radiation Oncology | Admitting: Radiation Oncology

## 2021-09-08 DIAGNOSIS — C50411 Malignant neoplasm of upper-outer quadrant of right female breast: Secondary | ICD-10-CM | POA: Diagnosis not present

## 2021-09-08 LAB — RAD ONC ARIA SESSION SUMMARY
Course Elapsed Days: 23
Plan Fractions Treated to Date: 16
Plan Prescribed Dose Per Fraction: 2.67 Gy
Plan Total Fractions Prescribed: 16
Plan Total Prescribed Dose: 42.72 Gy
Reference Point Dosage Given to Date: 42.72 Gy
Reference Point Session Dosage Given: 2.67 Gy
Session Number: 16

## 2021-09-21 ENCOUNTER — Ambulatory Visit: Payer: Commercial Managed Care - HMO | Attending: General Surgery | Admitting: Physical Therapy

## 2021-09-21 ENCOUNTER — Encounter: Payer: Self-pay | Admitting: Physical Therapy

## 2021-09-21 DIAGNOSIS — C50411 Malignant neoplasm of upper-outer quadrant of right female breast: Secondary | ICD-10-CM

## 2021-09-21 DIAGNOSIS — M25612 Stiffness of left shoulder, not elsewhere classified: Secondary | ICD-10-CM | POA: Diagnosis present

## 2021-09-21 DIAGNOSIS — Z483 Aftercare following surgery for neoplasm: Secondary | ICD-10-CM | POA: Diagnosis present

## 2021-09-21 DIAGNOSIS — M25611 Stiffness of right shoulder, not elsewhere classified: Secondary | ICD-10-CM

## 2021-09-21 DIAGNOSIS — R293 Abnormal posture: Secondary | ICD-10-CM

## 2021-09-21 DIAGNOSIS — Z170 Estrogen receptor positive status [ER+]: Secondary | ICD-10-CM | POA: Insufficient documentation

## 2021-09-21 NOTE — Therapy (Signed)
OUTPATIENT PHYSICAL THERAPY ONCOLOGY [facility]   Patient Name: Alexandria Hoover MRN: [id number] DOB:1955/05/19, 66 y.o., female Today's Date: 09/21/2021   PT End of Session - 09/21/21 0804     Visit Number 5    Number of Visits 6    Date for PT Re-Evaluation 08/18/21    PT Start Time 0804    PT Stop Time 0817    PT Time Calculation (min) 13 min    Activity Tolerance Patient tolerated treatment well    Behavior During Therapy [facility] for tasks assessed/performed             Past Medical History:  Diagnosis Date   Breast cancer ([place]) 2023   Diabetes ([place])    type 2   Heart murmur    Hypercholesteremia    Hypertension    Past Surgical History:  Procedure Laterality Date   ABDOMINAL HYSTERECTOMY     BREAST BIOPSY Right 06/11/2021   BREAST EXCISIONAL BIOPSY Right over 10 yrs   BREAST LUMPECTOMY WITH RADIOACTIVE SEED AND SENTINEL LYMPH NODE BIOPSY Right 07/06/2021   Procedure: RIGHT BREAST LUMPECTOMY WITH RADIOACTIVE SEED AND AXILLARY SENTINEL LYMPH NODE BIOPSY;  Surgeon: Rolm Bookbinder, MD;  Location: [place];  Service: General;  Laterality: Right;   TUBAL LIGATION     WISDOM TOOTH EXTRACTION     Patient Active Problem List   Diagnosis Date Noted   Genetic testing 06/30/2021   Family history of breast cancer 06/24/2021   Family history of prostate cancer 06/24/2021   Malignant neoplasm of upper-outer quadrant of right breast in female, estrogen receptor positive ([place]) 06/21/2021      REFERRING PROVIDER: Dr. Donne Hazel   REFERRING DIAG: Malignant neoplasm of upper-outer quadrant of right breast in female, estrogen receptor positive ([place])  THERAPY DIAG:  Stiffness of left shoulder, not elsewhere classified  Aftercare following surgery for neoplasm  Stiffness of right shoulder, not elsewhere classified  Abnormal posture  Malignant neoplasm of upper-outer quadrant of right breast in female, estrogen receptor positive ([place])  Rationale for  Evaluation and Treatment Rehabilitation  ONSET DATE: 04/12/2021  SUBJECTIVE:                                                                                                                                                                                           SUBJECTIVE STATEMENT: Everything is fine. No trouble with ROM or swelling.     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 It measures 1.7 cm and is located in the upper outer quadrant. It is ER/PR positive and HER2 negative with  a Ki67 of 2%. Also has hypertension and diabetes. Pt had lumpectomy with 0/3 sentinel nodes removed on 07/06/2021 . Pt will be starting radiation on 08/02/2021 and completed on 09/08/21.    PATIENT GOALS:  to get my arm back to normal  PAIN:  Are you having pain? No  PRECAUTIONS: Recent Surgery, right UE Lymphedema risk,   ACTIVITY LEVEL / LEISURE: Pt has not been walking but she has been doing the arm exercises with theraband   OBJECTIVE:   PATIENT SURVEYS:  QUICK DASH: 22.73  OBSERVATIONS:  Pt has well healing incisions in axilla and breast with steri strips still intact  POSTURE:  Forward head, forward shoulders, increased thoracic kyphosis  LYMPHEDEMA ASSESSMENT:  UPPER EXTREMITY AROM/PROM:   A/PROM RIGHT  06/23/2021   Right 07/27/2021 R 07/30/21 RIGHT 08/03/2021 R 09/21/21  Shoulder extension 48    75  Shoulder flexion 145 135 152 152 154  Shoulder abduction 125 120 150 152 161  Shoulder internal rotation 82    79  Shoulder external rotation 48    62                          (Blank rows = not tested)   A/PROM LEFT  06/23/2021 Left 07/27/2021  Shoulder extension 53   Shoulder flexion 125 145  Shoulder abduction 126 130  Shoulder internal rotation 70   Shoulder external rotation 56                           (Blank rows = not tested)    LYMPHEDEMA ASSESSMENTS:    LANDMARK RIGHT  06/23/2021 RIght 07/27/2021 R 8.8.23   10 cm proximal to olecranon process 35.3 36.5 34.6  Olecranon process 30.1 30.5 29.8  10 cm proximal to ulnar styloid process 26 27.3 24  Just proximal to ulnar styloid process 18._0 Across hand at thumb web space 20.4 20.8 20.5  At base of 2nd digit 6.8 6.6 6.5  (Blank rows = not tested)   LANDMARK LEFT  06/23/2021 Left 07/27/2021 L 09/21/21  10 cm proximal to olecranon process 36.8 37.5 36.2  Olecranon process 30._1 cm proximal to ulnar styloid process 26.2 25 24.4  Just proximal to ulnar styloid process 20.5 19.5 19.5  Across hand at thumb web space 20._2 At base of 2nd digit 6.8 6.8 6.5  (Blank rows = not tested)      Surgery type/Date: right lumpectomy  Number of lymph nodes removed: 3 Current/past treatment (chemo, radiation, hormone therapy): will start radiation soon  Other symptoms:  Heaviness/tightness No Pain No Pitting edema No Infections No Decreased scar mobility: not tested  Stemmer sign No   TREATMENT TODAY:   upgraded supine scapular series to red theraband backing down to 5 reps and instructed to add one per day til she gets to 10 . Instructed and practiced and delivered [place] program to email and text for squats. Back to wall overhead arm, bicep curls with red theraband  and one legged dead lift with arm support all 5 reps Pt able to perform without difficulty.  Reviewed lymphedema risk reduction practices especially protection of right arm from injury, burns, cuts and bug bites and pt acknowledged understanding.   PATIENT EDUCATION:  Education details: lymphedema risk reduction practices and upgraded home exericse  Person educated: Patient Education method: Explanation and Network engineer and texted [employer]  Education comprehension: verbalized understanding   HOME EXERCISE PROGRAM:  Reviewed previously given post op HEP. Upgraded to supine stretches and simulation of radiation position Supine scapular series with red band 5 reps  each and progress one per day til 10 reps  ADD WALKING PROGRAM   Access Code: [facility] URL: [email or web address] Date: 08/03/2021 Prepared by: Maudry Diego  Program Notes can progress to light weights as you feel able. Start with something like canned goods from your pantry   Exercises - Squat with Chair Touch  - 1 x daily - 7 x weekly - 1-3 sets - 10 reps - Standing Overhead Press at [employer]  - 1 x daily - 7 x weekly - 1-3 sets - 10 reps - Forward T with Counter Support  - 1 x daily - 7 x weekly - 3 sets - 10 reps - Standing Bicep Curls with Resistance  - 1 x daily - 7 x weekly - 3 sets - 10 reps     07/30/21: Educated pt on lymphedema risk reduction practices and issued handout; instruct pt in supine scapular strengthening series using yellow theraband and having pt return demonstrate 10 reps of each exercises while providing moderate verbal and tactile cues for pt to perform exercises correctly.   ASSESSMENT:  CLINICAL IMPRESSION: Reassessed pt's ROM and circumferential measurements after completion of radiation. She has returned to baseline shoulder ROM and does not demonstrate any signs of lymphedema. Encouraged pt to continue to do her exercises at least for the next 6 months since the effects of radiation can last this long. She verbalizes understanding. Also educated pt that she only needs to wear a compression sleeve if she flies or if her SOZO score is in the red. Pt is not interested in a compression sleeve rx at this time because she has no plans to travel soon. She will be discharged from skilled PT services at this time but will  continue her SOZO screening every 3 months for the first 2 years post op.      Pt will benefit from skilled therapeutic intervention to improve on the following deficits: Decreased knowledge of precautions, impaired UE functional use, pain, decreased ROM, postural dysfunction.   PT treatment/interventions: ADL/Self care home management,  Therapeutic exercises, Therapeutic activity, Neuromuscular re-education, Balance training, Gait training, Patient/Family education, Joint mobilization, and Aquatic Therapy     GOALS: Goals reviewed with patient? Yes  LONG TERM GOALS:  (STG=LTG)  GOALS Name Target Date  Goal status  1 Pt will demonstrate she has regained full shoulder ROM and function post operatively compared to baselines.  Baseline: 10/05/2021 MET  2 Pt will verbalize understanding of lymphedema risk reduction practices  10/05/2021 MET  3 Pt will be independent in HEP for ongoing shoulder ROM during radiation and strength for return to work  10/05/2021 MET  4        PLAN: PT FREQUENCY/DURATION: d/c today   PLAN FOR NEXT SESSION:  D/c from skilled PT services but Continue 3 month SOZO screening.  PHYSICAL THERAPY DISCHARGE SUMMARY  Visits from Start of Care: 5  Current functional level related to goals / functional outcomes: All goals met   Remaining deficits: None   Education / Equipment: HEP, lymphedema risk reduction   Patient agrees to discharge. Patient goals were met. Patient is being discharged due to meeting the stated rehab goals.  [facility] [place], PT 09/21/2021, 8:45 AM

## 2021-09-28 ENCOUNTER — Inpatient Hospital Stay: Payer: Commercial Managed Care - HMO | Attending: Hematology and Oncology | Admitting: Hematology and Oncology

## 2021-09-28 ENCOUNTER — Other Ambulatory Visit: Payer: Self-pay

## 2021-09-28 ENCOUNTER — Encounter: Payer: Self-pay | Admitting: Hematology and Oncology

## 2021-09-28 DIAGNOSIS — Z79811 Long term (current) use of aromatase inhibitors: Secondary | ICD-10-CM | POA: Diagnosis not present

## 2021-09-28 DIAGNOSIS — Z170 Estrogen receptor positive status [ER+]: Secondary | ICD-10-CM | POA: Diagnosis not present

## 2021-09-28 DIAGNOSIS — Z923 Personal history of irradiation: Secondary | ICD-10-CM | POA: Insufficient documentation

## 2021-09-28 DIAGNOSIS — Z888 Allergy status to other drugs, medicaments and biological substances status: Secondary | ICD-10-CM | POA: Diagnosis not present

## 2021-09-28 DIAGNOSIS — Z79899 Other long term (current) drug therapy: Secondary | ICD-10-CM | POA: Insufficient documentation

## 2021-09-28 DIAGNOSIS — Z803 Family history of malignant neoplasm of breast: Secondary | ICD-10-CM | POA: Insufficient documentation

## 2021-09-28 DIAGNOSIS — Z800 Family history of malignant neoplasm of digestive organs: Secondary | ICD-10-CM | POA: Insufficient documentation

## 2021-09-28 DIAGNOSIS — Z885 Allergy status to narcotic agent status: Secondary | ICD-10-CM | POA: Insufficient documentation

## 2021-09-28 DIAGNOSIS — Z8042 Family history of malignant neoplasm of prostate: Secondary | ICD-10-CM | POA: Diagnosis not present

## 2021-09-28 DIAGNOSIS — Z808 Family history of malignant neoplasm of other organs or systems: Secondary | ICD-10-CM | POA: Insufficient documentation

## 2021-09-28 DIAGNOSIS — C50411 Malignant neoplasm of upper-outer quadrant of right female breast: Secondary | ICD-10-CM | POA: Insufficient documentation

## 2021-09-28 MED ORDER — ANASTROZOLE 1 MG PO TABS: 1.0000 mg | ORAL_TABLET | Freq: Every day | ORAL | 3 refills | Status: DC

## 2021-09-28 NOTE — Assessment & Plan Note (Addendum)
This is a very pleasant 66 year old postmenopausal female patient with newly diagnosed right breast invasive ductal carcinoma, grade 1 ER/PR positive, KI of 2%, HER2 FISH pending referred to breast [place] for recommendations.  Given small tumor, architectural distortion measuring about 1.7 cm, strong ER/PR positivity, HER2 FISH negative, she underwent lumpectomy upfront.  This showed final pathology with 1.5 mm grade 1 IDC, negative margins, all lymph nodes benign. Given tumor of 5 mm, I have not recommended any Oncotype testing. She is now post adjuvant radiation therapy. She is now status post adjuvant radiation.  Today we have once again discussed about role of antiestrogen therapy.  We have discussed about options for antiestrogen therapy including mechanism of action of tamoxifen versus aromatase inhibitors, adverse effects with each class.  She would like to proceed with anastrozole.  This has been prescribed to the pharmacy of her choice.  Baseline bone density ordered. She will return to clinic in 3 months with survivorship clinic in 6 months with me.  For bone health, recommended she start taking some vitamin D, exercise such as walking at least 30 minutes a day and she expressed understanding.

## 2021-09-28 NOTE — Progress Notes (Signed)
[facility] NOTE  Patient Care Team: Maurice Small, MD as PCP - General (Family Medicine) Rolm Bookbinder, MD as Consulting Physician (General Surgery) Benay Pike, MD as Consulting Physician (Hematology and Oncology) Gery Pray, MD as Consulting Physician (Radiation Oncology) Mauro Kaufmann, RN as Oncology Nurse Navigator Rockwell Germany, RN as Oncology Nurse Navigator  CHIEF COMPLAINTS/PURPOSE OF CONSULTATION:  Newly diagnosed breast cancer  HISTORY OF PRESENTING ILLNESS:  Alexandria Hoover 66 y.o. female is here because of recent diagnosis of right IDC breast cancer  I reviewed her records extensively and collaborated the history with the patient.  SUMMARY OF ONCOLOGIC HISTORY: Oncology History  Malignant neoplasm of upper-outer quadrant of right breast in female, estrogen receptor positive ([place])  04/12/2021 Mammogram   Mammogram from April 12, 2021 showed possible distortion in the right breast and additional imaging was recommended. Diagnostic mammogram confirmed indeterminate architectural distortion involving the upper outer quadrant of the right breast at middle depth adjacent to a group of benign calcifications.  Stereotactic tomosynthesis core needle biopsy of the right breast was recommended   06/11/2021 Pathology Results   Pathology from the breast biopsy showed invasive mammary carcinoma ductal subtype, grade 1.  Prognostic showed ER 100% positive strong staining PR 100% positive strong staining, KI of 2% and HER2 by FISH pending   06/21/2021 Initial Diagnosis   Malignant neoplasm of upper-outer quadrant of right breast in female, estrogen receptor positive ([place])   06/23/2021 Cancer Staging   Staging form: Breast, AJCC 8th Edition - Clinical stage from 06/23/2021: Stage IA (cT1c, cN0, cM0, G1, ER+, PR+, HER2-) - Signed by Benay Pike, MD on 06/23/2021 Stage prefix: Initial diagnosis Histologic grading system: 3 grade system Laterality:  Right Staged by: Pathologist and managing physician Stage used in treatment planning: Yes National guidelines used in treatment planning: Yes Type of national guideline used in treatment planning: NCCN    Genetic Testing   [facility] CustomNext Panel was Negative. Report date is 07/05/2021.  The CustomNext-Cancer+RNAinsight panel offered by [facility] includes sequencing and rearrangement analysis for the following 47 genes:  APC, ATM, AXIN2, BARD1, BMPR1A, BRCA1, BRCA2, BRIP1, CDH1, CDK4, CDKN2A, CHEK2, CTNNA1, DICER1, EPCAM, GREM1, HOXB13, KIT, MEN1, MLH1, MSH2, MSH3, MSH6, MUTYH, NBN, NF1, NTHL1, PALB2, PDGFRA, PMS2, POLD1, POLE, PTEN, RAD50, RAD51C, RAD51D, SDHA, SDHB, SDHC, SDHD, SMAD4, SMARCA4, STK11, TP53, TSC1, TSC2, and VHL.  RNA data is routinely analyzed for use in variant interpretation for all genes.   07/06/2021 Pathology Results   Pathology from right breast lumpectomy showed 1.5 mm grade 1 IDC, negative margins, all lymph nodes benign, all margins clear, prior prognostic showed ER 100% positive strong staining PR 100% positive strong staining, Ki-67 of 2% and HER2 negative   07/06/2021 Surgery   RIGHT BREAST, LUMPECTOMY: pT1a, pN0    - 09/08/2021 Radiation Therapy   Completed definitive radiation to right breast and axilla.    Interval history  Patient is here for follow-up by herself.  She completed radiation end of July. She denies any new health complaints.  MEDICAL HISTORY:  Past Medical History:  Diagnosis Date   Breast cancer ([place]) 2023   Diabetes ([place])    type 2   Heart murmur    Hypercholesteremia    Hypertension     SURGICAL HISTORY: Past Surgical History:  Procedure Laterality Date   ABDOMINAL HYSTERECTOMY     BREAST BIOPSY Right 06/11/2021   BREAST EXCISIONAL BIOPSY Right over 10 yrs   BREAST LUMPECTOMY WITH RADIOACTIVE SEED  AND SENTINEL LYMPH NODE BIOPSY Right 07/06/2021   Procedure: RIGHT BREAST LUMPECTOMY WITH RADIOACTIVE SEED AND AXILLARY SENTINEL  LYMPH NODE BIOPSY;  Surgeon: Rolm Bookbinder, MD;  Location: [facility];  Service: General;  Laterality: Right;   TUBAL LIGATION     WISDOM TOOTH EXTRACTION      SOCIAL HISTORY: Social History   Socioeconomic History   Marital status: Divorced    Spouse name: Not on file   Number of children: 1   Years of education: Not on file   Highest education level: Not on file  Occupational History   Not on file  Tobacco Use   Smoking status: Never   Smokeless tobacco: Never  Vaping Use   Vaping Use: Never used  Substance and Sexual Activity   Alcohol use: Never   Drug use: Never   Sexual activity: Not on file  Other Topics Concern   Not on file  Social History Narrative   Not on file   Social Determinants of Health   Financial Resource Strain: Medium Risk (06/23/2021)   Overall Financial Resource Strain (CARDIA)    Difficulty of Paying Living Expenses: Somewhat hard  Food Insecurity: No Food Insecurity (06/23/2021)   Hunger Vital Sign    Worried About Running Out of Food in the Last Year: Never true    Ran Out of Food in the Last Year: Never true  Transportation Needs: No Transportation Needs (06/23/2021)   PRAPARE - Hydrologist (Medical): No    Lack of Transportation (Non-Medical): No  Physical Activity: Not on file  Stress: Not on file  Social Connections: Not on file  Intimate Partner Violence: Not on file    FAMILY HISTORY: Family History  Problem Relation Age of Onset   Throat cancer Father    Cancer Sister    Breast cancer Sister 27   Pancreatic cancer Brother        maternal half-brother   Throat cancer Brother        maternal half-brother   Breast cancer Paternal Aunt    Prostate cancer Paternal Uncle    Prostate cancer Cousin     ALLERGIES:  is allergic to codeine and lisinopril.  MEDICATIONS:  Current Outpatient Medications  Medication Sig Dispense Refill   anastrozole (ARIMIDEX) 1 MG tablet Take 1 tablet  (1 mg total) by mouth daily. 90 tablet 3   amLODipine (NORVASC) 10 MG tablet Take 10 mg by mouth daily.     Cholecalciferol (VITAMIN D3) 250 MCG (10000 UT) capsule 10,000 Units daily.     loratadine (CLARITIN) 10 MG tablet Take 10 mg by mouth daily as needed for allergies.     losartan-hydrochlorothiazide (HYZAAR) 100-25 MG tablet Take 1 tablet by mouth daily.     metFORMIN (GLUCOPHAGE-XR) 500 MG 24 hr tablet 500 mg daily with breakfast.     pravastatin (PRAVACHOL) 40 MG tablet 1 tablet     No current facility-administered medications for this visit.    REVIEW OF SYSTEMS:   Constitutional: Denies fevers, chills or abnormal night sweats Eyes: Denies blurriness of vision, double vision or watery eyes Ears, nose, mouth, throat, and face: Denies mucositis or sore throat Respiratory: Denies cough, dyspnea or wheezes Cardiovascular: Denies palpitation, chest discomfort or lower extremity swelling Gastrointestinal:  Denies nausea, heartburn or change in bowel habits Skin: Denies abnormal skin rashes Lymphatics: Denies new lymphadenopathy or easy bruising Neurological:Denies numbness, tingling or new weaknesses Behavioral/Psych: Mood is stable, no new changes  Breast: Denies any palpable lumps or discharge All other systems were reviewed with the patient and are negative.  PHYSICAL EXAMINATION: ECOG PERFORMANCE STATUS: 0 - Asymptomatic  Vitals:   09/28/21 0846  BP: 124/61  Pulse: 68  Resp: 18  Temp: 97.9 F (36.6 C)  SpO2: 98%   Filed Weights   09/28/21 0846  Weight: 186 lb 9.6 oz (84.6 kg)    Physical Exam Constitutional:      Appearance: Normal appearance.  Chest:       Comments: Right breast with some post radiation changes, some skin desquamation in the right axilla Neurological:     Mental Status: She is alert.      LABORATORY DATA:  I have reviewed the data as listed Lab Results  Component Value Date   WBC 7.0 06/23/2021   HGB 12.3 06/23/2021   HCT 39.5  06/23/2021   MCV 77.3 (L) 06/23/2021   PLT 361 06/23/2021   Lab Results  Component Value Date   NA 140 07/02/2021   K 3.3 (L) 07/02/2021   CL 103 07/02/2021   CO2 27 07/02/2021    RADIOGRAPHIC STUDIES: I have personally reviewed the radiological reports and agreed with the findings in the report.  ASSESSMENT AND PLAN:  Malignant neoplasm of upper-outer quadrant of right breast in female, estrogen receptor positive ([place]) This is a very pleasant 66 year old postmenopausal female patient with newly diagnosed right breast invasive ductal carcinoma, grade 1 ER/PR positive, KI of 2%, HER2 FISH pending referred to breast [facility] for recommendations.  Given small tumor, architectural distortion measuring about 1.7 cm, strong ER/PR positivity, HER2 FISH negative, she underwent lumpectomy upfront.  This showed final pathology with 1.5 mm grade 1 IDC, negative margins, all lymph nodes benign. Given tumor of 5 mm, I have not recommended any Oncotype testing. She is now post adjuvant radiation therapy. She is now status post adjuvant radiation.  Today we have once again discussed about role of antiestrogen therapy.  We have discussed about options for antiestrogen therapy including mechanism of action of tamoxifen versus aromatase inhibitors, adverse effects with each class.  She would like to proceed with anastrozole.  This has been prescribed to the pharmacy of her choice.  Baseline bone density ordered. She will return to clinic in 3 months with survivorship clinic in 6 months with me.  For bone health, recommended she start taking some vitamin D, exercise such as walking at least 30 minutes a day and she expressed understanding. Total time spent: 30 minutes  All questions were answered. The patient knows to call the clinic with any problems, questions or concerns.    Benay Pike, MD 09/28/21

## 2021-10-06 ENCOUNTER — Encounter: Payer: Self-pay | Admitting: Radiation Oncology

## 2021-10-10 NOTE — Progress Notes (Incomplete)
  Radiation Oncology         [phone] ________________________________  Patient Name: Alexandria Hoover MRN: [id number] DOB: May 26, 1955 Referring Physician: Maurice Small, (Profile Not Attached) Date of Service: 09/08/2021  [facility] [place]                                                        End Of Treatment Note  Diagnoses: C50.411-Malignant neoplasm of upper-outer quadrant of right female breast  Cancer Staging: S/p lumpectomy: Stage IA (cT1c, cN0, cM0) Right Breast UOQ, Invasive Ductal Carcinoma, ER+ / PR+ / Her2-, Grade 1  Intent: Curative  Radiation Treatment Dates: 08/16/2021 through 09/08/2021 Site Technique Total Dose (Gy) Dose per Fx (Gy) Completed Fx Beam Energies  Breast, Right: Breast_R 3D 42.72/42.72 2.67 16/16 10X   Narrative: The patient tolerated radiation therapy relatively well. During her final weekly treatment check on 09/07/21, the patient reported fatigue, and hyperpigmentation changes. Physical exam performed on that same date revealed some hyperpigmentation changes and mild erythema to the right breast area, and minimal swelling.  Plan: The patient will follow-up with radiation oncology in one month .  ________________________________________________ -----------------------------------  Blair Promise, PhD, MD  This document serves as a record of services personally performed by Gery Pray, MD. It was created on his behalf by Roney Mans, a trained medical scribe. The creation of this record is based on the scribe's personal observations and the provider's statements to them. This document has been checked and approved by the attending provider.

## 2021-10-10 NOTE — Progress Notes (Signed)
Radiation Oncology         [phone] ________________________________  Name: ANH BIGOS MRN: [id number]  Date: 10/11/2021  DOB: 08/09/1955  Follow-Up Visit Note  CC: Maurice Small, MD  Maurice Small, MD  No diagnosis found.  Diagnosis: S/p lumpectomy: Stage IA (cT1c, cN0, cM0) Right Breast UOQ, Invasive Ductal Carcinoma, ER+ / PR+ / Her2-, Grade 1   Interval Since Last Radiation: 1 month and 2 days   Intent: Curative  Radiation Treatment Dates: 08/16/2021 through 09/08/2021 Site Technique Total Dose (Gy) Dose per Fx (Gy) Completed Fx Beam Energies  Breast, Right: Breast_R 3D 42.72/42.72 2.67 16/16 10X    Narrative:  The patient returns today for routine follow-up.  The patient tolerated radiation therapy relatively well. During her final weekly treatment check on 09/07/21, the patient reported fatigue, and hyperpigmentation changes. Physical exam performed on that same date revealed some hyperpigmentation changes and mild erythema to the right breast area, and minimal swelling.    Since completing radiation, the patient followed up with Dr. Chryl Heck on 09/28/21 to initiate antiestrogen therapy consisting of anastrozole. (Dr. Chryl Heck has also ordered a bone density study).  The patient has also been meeting with OP rehab for management of left shoulder stiffness.   Otherwise, no significant interval history since the patient completed radiation therapy.                            Allergies:  is allergic to codeine and lisinopril.  Meds: Current Outpatient Medications  Medication Sig Dispense Refill   amLODipine (NORVASC) 10 MG tablet Take 10 mg by mouth daily.     anastrozole (ARIMIDEX) 1 MG tablet Take 1 tablet (1 mg total) by mouth daily. 90 tablet 3   Cholecalciferol (VITAMIN D3) 250 MCG (10000 UT) capsule 10,000 Units daily.     loratadine (CLARITIN) 10 MG tablet Take 10 mg by mouth daily as needed for allergies.     losartan-hydrochlorothiazide (HYZAAR) 100-25 MG tablet Take  1 tablet by mouth daily.     metFORMIN (GLUCOPHAGE-XR) 500 MG 24 hr tablet 500 mg daily with breakfast.     pravastatin (PRAVACHOL) 40 MG tablet 1 tablet     No current facility-administered medications for this encounter.    Physical Findings: The patient is in no acute distress. Patient is alert and oriented.  vitals were not taken for this visit. .  No significant changes. Lungs are clear to auscultation bilaterally. Heart has regular rate and rhythm. No palpable cervical, supraclavicular, or axillary adenopathy. Abdomen soft, non-tender, normal bowel sounds.  Left Breast: no palpable mass, nipple discharge or bleeding. Right Breast: ***   Lab Findings: Lab Results  Component Value Date   WBC 7.0 06/23/2021   HGB 12.3 06/23/2021   HCT 39.5 06/23/2021   MCV 77.3 (L) 06/23/2021   PLT 361 06/23/2021    Radiographic Findings: No results found.  Impression: S/p lumpectomy: Stage IA (cT1c, cN0, cM0) Right Breast UOQ, Invasive Ductal Carcinoma, ER+ / PR+ / Her2-, Grade 1   The patient is recovering from the effects of radiation.  ***  Plan:  ***   *** minutes of total time was spent for this patient encounter, including preparation, face-to-face counseling with the patient and coordination of care, physical exam, and documentation of the encounter. ____________________________________  Blair Promise, PhD, MD  This document serves as a record of services personally performed by Gery Pray, MD. It was created on his  behalf by Roney Mans, a trained medical scribe. The creation of this record is based on the scribe's personal observations and the provider's statements to them. This document has been checked and approved by the attending provider.

## 2021-10-11 ENCOUNTER — Ambulatory Visit
Admission: RE | Admit: 2021-10-11 | Discharge: 2021-10-11 | Disposition: A | Discharge status: Home / Self Care | Payer: Commercial Managed Care - HMO | Source: Ambulatory Visit | Attending: Radiation Oncology | Admitting: Radiation Oncology

## 2021-10-11 ENCOUNTER — Other Ambulatory Visit: Payer: Self-pay

## 2021-10-11 ENCOUNTER — Telehealth: Payer: Self-pay

## 2021-10-11 ENCOUNTER — Encounter: Payer: Self-pay | Admitting: Radiation Oncology

## 2021-10-11 DIAGNOSIS — Z79811 Long term (current) use of aromatase inhibitors: Secondary | ICD-10-CM | POA: Insufficient documentation

## 2021-10-11 DIAGNOSIS — Z7984 Long term (current) use of oral hypoglycemic drugs: Secondary | ICD-10-CM | POA: Diagnosis not present

## 2021-10-11 DIAGNOSIS — C50411 Malignant neoplasm of upper-outer quadrant of right female breast: Secondary | ICD-10-CM | POA: Diagnosis present

## 2021-10-11 DIAGNOSIS — Z79899 Other long term (current) drug therapy: Secondary | ICD-10-CM | POA: Diagnosis not present

## 2021-10-11 DIAGNOSIS — Z170 Estrogen receptor positive status [ER+]: Secondary | ICD-10-CM | POA: Insufficient documentation

## 2021-10-11 DIAGNOSIS — Z923 Personal history of irradiation: Secondary | ICD-10-CM | POA: Diagnosis not present

## 2021-10-11 HISTORY — DX: Personal history of irradiation: Z92.3

## 2021-10-11 NOTE — Telephone Encounter (Signed)
Call placed to patient to make aware that Alexandria Hoover is ready to be picked up at the [facility] on [address]. Message left.

## 2021-10-11 NOTE — Progress Notes (Addendum)
Alexandria Hoover is here today for follow up post radiation to the breast.   Breast Side:Right.   They completed their radiation on: 09/08/21   Does the patient complain of any of the following: Post radiation skin issues: Continues to be hyperpigmented.  Breast Tenderness: Patient reports having some pain to right breast yesterday. Breast Swelling: No Lymphadema: No Range of Motion limitations: Reports pain when lifting right arm at times.  Fatigue post radiation: Continues to have mild fatigue.  Appetite good/fair/poor: Good.  Additional comments if applicable: Patient has not yet started taking anastrozole.    BP (!) 156/68 (BP Location: Left Arm, Patient Position: Sitting, Cuff Size: Large)   Pulse 74   Temp 98.7 F (37.1 C) (Oral)   Resp 16   Wt 183 lb 6 oz (83.2 kg)   SpO2 97%   BMI 31.48 kg/m

## 2021-11-08 ENCOUNTER — Ambulatory Visit: Payer: Commercial Managed Care - HMO | Attending: General Surgery

## 2021-11-08 VITALS — Wt 183.4 lb

## 2021-11-08 DIAGNOSIS — Z483 Aftercare following surgery for neoplasm: Secondary | ICD-10-CM | POA: Insufficient documentation

## 2021-11-08 NOTE — Therapy (Signed)
  OUTPATIENT PHYSICAL THERAPY SOZO SCREENING NOTE   Patient Name: Alexandria Hoover MRN: [id number] DOB:1955-08-30, 66 y.o., female Today's Date: 11/08/2021  PCP: Maurice Small, MD REFERRING PROVIDER: Rolm Bookbinder, MD   PT End of Session - 11/08/21 0804     Visit Number 5   # unchanged due to screen only   PT Start Time 0801    PT Stop Time 0806    PT Time Calculation (min) 5 min    Activity Tolerance Patient tolerated treatment well    Behavior During Therapy [facility] for tasks assessed/performed             Past Medical History:  Diagnosis Date   Breast cancer ([place]) 2023   Diabetes ([place])    type 2   Heart murmur    History of radiation therapy    Right breast 08/16/21-09/08/21-Dr. Gery Pray   Hypercholesteremia    Hypertension    Past Surgical History:  Procedure Laterality Date   ABDOMINAL HYSTERECTOMY     BREAST BIOPSY Right 06/11/2021   BREAST EXCISIONAL BIOPSY Right over 10 yrs   BREAST LUMPECTOMY WITH RADIOACTIVE SEED AND SENTINEL LYMPH NODE BIOPSY Right 07/06/2021   Procedure: RIGHT BREAST LUMPECTOMY WITH RADIOACTIVE SEED AND AXILLARY SENTINEL LYMPH NODE BIOPSY;  Surgeon: Rolm Bookbinder, MD;  Location: [facility];  Service: General;  Laterality: Right;   TUBAL LIGATION     WISDOM TOOTH EXTRACTION     Patient Active Problem List   Diagnosis Date Noted   Genetic testing 06/30/2021   Family history of breast cancer 06/24/2021   Family history of prostate cancer 06/24/2021   Malignant neoplasm of upper-outer quadrant of right breast in female, estrogen receptor positive ([place]) 06/21/2021    REFERRING DIAG: right breast cancer at risk for lymphedema  THERAPY DIAG:  Aftercare following surgery for neoplasm  PERTINENT HISTORY: Patient was diagnosed on 04/12/2021 with right grade 1 invasive ductal carcinoma breast cancer. It measures 1.7 cm and is located in the upper outer quadrant. It is ER/PR positive and HER2 negative with a Ki67 of 2%.  Also has hypertension and diabetes. Pt had lumpectomy with 0/3 sentinel nodes removed on 07/06/2021 . Pt will be starting radiation on 08/02/2021 and completed on 09/08/21.   PRECAUTIONS: right UE Lymphedema risk, None  SUBJECTIVE: Pt returns for her first 3 month L-Dex screen .  PAIN:  Are you having pain? No  SOZO SCREENING: Patient was assessed today using the SOZO machine to determine the lymphedema index score. This was compared to her baseline score. It was determined that she is within the recommended range when compared to her baseline and no further action is needed at this time. She will continue SOZO screenings. These are done every 3 months for 2 years post operatively followed by every 6 months for 2 years, and then annually.   L-DEX FLOWSHEETS - 11/08/21 0800       L-DEX LYMPHEDEMA SCREENING   Measurement Type Unilateral    L-DEX MEASUREMENT EXTREMITY Upper Extremity    POSITION  Standing    DOMINANT SIDE Right    At Risk Side Right    BASELINE SCORE (UNILATERAL) 0    L-DEX SCORE (UNILATERAL) -5.3    VALUE CHANGE (UNILAT) -5.3               Otelia Limes, PTA 11/08/2021, 8:05 AM

## 2021-11-18 ENCOUNTER — Other Ambulatory Visit: Payer: Commercial Managed Care - HMO

## 2021-11-19 ENCOUNTER — Ambulatory Visit
Admission: RE | Admit: 2021-11-19 | Discharge: 2021-11-19 | Disposition: A | Discharge status: Home / Self Care | Payer: Commercial Managed Care - HMO | Source: Ambulatory Visit | Attending: Hematology and Oncology | Admitting: Hematology and Oncology

## 2021-11-19 DIAGNOSIS — Z170 Estrogen receptor positive status [ER+]: Secondary | ICD-10-CM

## 2021-11-19 DIAGNOSIS — C50411 Malignant neoplasm of upper-outer quadrant of right female breast: Secondary | ICD-10-CM

## 2021-12-30 ENCOUNTER — Telehealth: Payer: Self-pay | Admitting: Adult Health

## 2021-12-30 ENCOUNTER — Other Ambulatory Visit: Payer: Self-pay

## 2021-12-30 ENCOUNTER — Inpatient Hospital Stay: Payer: Commercial Managed Care - HMO | Attending: Adult Health | Admitting: Adult Health

## 2021-12-30 ENCOUNTER — Encounter: Payer: Self-pay | Admitting: Adult Health

## 2021-12-30 VITALS — BP 157/75 | HR 72 | Temp 97.0°F | Resp 16 | Wt 185.5 lb

## 2021-12-30 DIAGNOSIS — Z170 Estrogen receptor positive status [ER+]: Secondary | ICD-10-CM | POA: Diagnosis not present

## 2021-12-30 DIAGNOSIS — R232 Flushing: Secondary | ICD-10-CM | POA: Diagnosis not present

## 2021-12-30 DIAGNOSIS — Z923 Personal history of irradiation: Secondary | ICD-10-CM | POA: Diagnosis not present

## 2021-12-30 DIAGNOSIS — Z79899 Other long term (current) drug therapy: Secondary | ICD-10-CM | POA: Insufficient documentation

## 2021-12-30 DIAGNOSIS — Z809 Family history of malignant neoplasm, unspecified: Secondary | ICD-10-CM | POA: Diagnosis not present

## 2021-12-30 DIAGNOSIS — C50411 Malignant neoplasm of upper-outer quadrant of right female breast: Secondary | ICD-10-CM | POA: Diagnosis present

## 2021-12-30 DIAGNOSIS — Z803 Family history of malignant neoplasm of breast: Secondary | ICD-10-CM | POA: Diagnosis not present

## 2021-12-30 DIAGNOSIS — Z885 Allergy status to narcotic agent status: Secondary | ICD-10-CM | POA: Insufficient documentation

## 2021-12-30 DIAGNOSIS — Z888 Allergy status to other drugs, medicaments and biological substances status: Secondary | ICD-10-CM | POA: Insufficient documentation

## 2021-12-30 DIAGNOSIS — Z5986 Financial insecurity: Secondary | ICD-10-CM | POA: Insufficient documentation

## 2021-12-30 DIAGNOSIS — Z8042 Family history of malignant neoplasm of prostate: Secondary | ICD-10-CM | POA: Diagnosis not present

## 2021-12-30 DIAGNOSIS — Z7182 Exercise counseling: Secondary | ICD-10-CM | POA: Insufficient documentation

## 2021-12-30 DIAGNOSIS — Z79811 Long term (current) use of aromatase inhibitors: Secondary | ICD-10-CM | POA: Diagnosis not present

## 2021-12-30 DIAGNOSIS — R61 Generalized hyperhidrosis: Secondary | ICD-10-CM | POA: Insufficient documentation

## 2021-12-30 DIAGNOSIS — Z808 Family history of malignant neoplasm of other organs or systems: Secondary | ICD-10-CM | POA: Insufficient documentation

## 2021-12-30 NOTE — Telephone Encounter (Signed)
Per Joni Reining at [facility] [phone] called  to fax over medical records from most recent appointment for Ms Delaurentis. Received fax today and  was placed on Lindsey's desk.

## 2021-12-30 NOTE — Progress Notes (Signed)
SURVIVORSHIP VISIT:   BRIEF ONCOLOGIC HISTORY:  Oncology History  Malignant neoplasm of upper-outer quadrant of right breast in female, estrogen receptor positive ([place])  04/12/2021 Mammogram   Mammogram from April 12, 2021 showed possible distortion in the right breast and additional imaging was recommended. Diagnostic mammogram confirmed indeterminate architectural distortion involving the upper outer quadrant of the right breast at middle depth adjacent to a group of benign calcifications.  Stereotactic tomosynthesis core needle biopsy of the right breast was recommended   06/11/2021 Pathology Results   Pathology from the breast biopsy showed invasive mammary carcinoma ductal subtype, grade 1.  Prognostic showed ER 100% positive strong staining PR 100% positive strong staining, KI of 2% and HER2 by FISH pending   06/21/2021 Initial Diagnosis   Malignant neoplasm of upper-outer quadrant of right breast in female, estrogen receptor positive ([place])   06/23/2021 Cancer Staging   Staging form: Breast, AJCC 8th Edition - Clinical stage from 06/23/2021: Stage IA (cT1c, cN0, cM0, G1, ER+, PR+, HER2-) - Signed by Benay Pike, MD on 06/23/2021 Stage prefix: Initial diagnosis Histologic grading system: 3 grade system Laterality: Right Staged by: Pathologist and managing physician Stage used in treatment planning: Yes National guidelines used in treatment planning: Yes Type of national guideline used in treatment planning: NCCN    Genetic Testing   [facility] CustomNext Panel was Negative. Report date is 07/05/2021.  The CustomNext-Cancer+RNAinsight panel offered by [facility] includes sequencing and rearrangement analysis for the following 47 genes:  APC, ATM, AXIN2, BARD1, BMPR1A, BRCA1, BRCA2, BRIP1, CDH1, CDK4, CDKN2A, CHEK2, CTNNA1, DICER1, EPCAM, GREM1, HOXB13, KIT, MEN1, MLH1, MSH2, MSH3, MSH6, MUTYH, NBN, NF1, NTHL1, PALB2, PDGFRA, PMS2, POLD1, POLE, PTEN, RAD50, RAD51C, RAD51D, SDHA, SDHB,  SDHC, SDHD, SMAD4, SMARCA4, STK11, TP53, TSC1, TSC2, and VHL.  RNA data is routinely analyzed for use in variant interpretation for all genes.   07/06/2021 Pathology Results   Pathology from right breast lumpectomy showed 1.5 mm grade 1 IDC, negative margins, all lymph nodes benign, all margins clear, prior prognostic showed ER 100% positive strong staining PR 100% positive strong staining, Ki-67 of 2% and HER2 negative   07/06/2021 Surgery   RIGHT BREAST, LUMPECTOMY: pT1a, pN0   08/16/2021 - 09/08/2021 Radiation Therapy   Site Technique Total Dose (Gy) Dose per Fx (Gy) Completed Fx Beam Energies  Breast, Right: Breast_R 3D 42.72/42.72 2.67 16/16 10X     09/2021 -  Anti-estrogen oral therapy   Anastrozole     INTERVAL HISTORY:  Alexandria Hoover to review her survivorship care plan detailing her treatment course for breast cancer, as well as monitoring long-term side effects of that treatment, education regarding health maintenance, screening, and overall wellness and health promotion.     Overall, Alexandria Hoover reports feeling quite well.  She is taking Anastrozole daily.  She experiences some hot flashes/night sweats that occur nightly.  These are tolerable.  She sees her pcp regularly at [facility]  She cannot recall their name.  She has not been exercising regularly.    REVIEW OF SYSTEMS:  Review of Systems  Constitutional:  Negative for appetite change, chills, fatigue, fever and unexpected weight change.  HENT:   Negative for hearing loss, lump/mass and trouble swallowing.   Eyes:  Negative for eye problems and icterus.  Respiratory:  Negative for chest tightness, cough and shortness of breath.   Cardiovascular:  Negative for chest pain, leg swelling and palpitations.  Gastrointestinal:  Negative for abdominal distention, abdominal pain, constipation, diarrhea, nausea and vomiting.  Endocrine: Positive for hot flashes (tolerable).  Genitourinary:  Negative for difficulty urinating.    Musculoskeletal:  Negative for arthralgias.  Skin:  Negative for itching and rash.  Neurological:  Negative for dizziness, extremity weakness, headaches and numbness.  Hematological:  Negative for adenopathy. Does not bruise/bleed easily.  Psychiatric/Behavioral:  Negative for depression. The patient is not nervous/anxious.    Breast: Denies any new nodularity, masses, tenderness, nipple changes, or nipple discharge.      ONCOLOGY TREATMENT TEAM:  1. Surgeon:  Dr. Donne Hazel at [facility] 2. Medical Oncologist: Dr. Chryl Heck  3. Radiation Oncologist: Dr. Sondra Come    PAST MEDICAL/SURGICAL HISTORY:  Past Medical History:  Diagnosis Date   Breast cancer ([place]) 2023   Diabetes ([place])    type 2   Heart murmur    History of radiation therapy    Right breast 08/16/21-09/08/21-Dr. Gery Pray   Hypercholesteremia    Hypertension    Past Surgical History:  Procedure Laterality Date   ABDOMINAL HYSTERECTOMY     BREAST BIOPSY Right 06/11/2021   BREAST EXCISIONAL BIOPSY Right over 10 yrs   BREAST LUMPECTOMY WITH RADIOACTIVE SEED AND SENTINEL LYMPH NODE BIOPSY Right 07/06/2021   Procedure: RIGHT BREAST LUMPECTOMY WITH RADIOACTIVE SEED AND AXILLARY SENTINEL LYMPH NODE BIOPSY;  Surgeon: Rolm Bookbinder, MD;  Location: [place];  Service: General;  Laterality: Right;   TUBAL LIGATION     WISDOM TOOTH EXTRACTION       ALLERGIES:  Allergies  Allergen Reactions   Codeine Nausea And Vomiting   Lisinopril Other (See Comments)    cough     CURRENT MEDICATIONS:  Outpatient Encounter Medications as of 12/30/2021  Medication Sig   amLODipine (NORVASC) 10 MG tablet Take 10 mg by mouth daily.   anastrozole (ARIMIDEX) 1 MG tablet Take 1 tablet (1 mg total) by mouth daily. (Patient not taking: Reported on 10/11/2021)   Cholecalciferol (VITAMIN D3) 250 MCG (10000 UT) capsule 10,000 Units daily.   loratadine (CLARITIN) 10 MG tablet Take 10 mg by mouth daily as  needed for allergies.   losartan-hydrochlorothiazide (HYZAAR) 100-25 MG tablet Take 1 tablet by mouth daily.   metFORMIN (GLUCOPHAGE-XR) 500 MG 24 hr tablet 500 mg daily with breakfast.   pravastatin (PRAVACHOL) 40 MG tablet 1 tablet   No facility-administered encounter medications on file as of 12/30/2021.     ONCOLOGIC FAMILY HISTORY:  Family History  Problem Relation Age of Onset   Throat cancer Father    Cancer Sister    Breast cancer Sister 6   Pancreatic cancer Brother        maternal half-brother   Throat cancer Brother        maternal half-brother   Breast cancer Paternal Aunt    Prostate cancer Paternal Uncle    Prostate cancer Cousin      SOCIAL HISTORY:  Social History   Socioeconomic History   Marital status: Divorced    Spouse name: Not on file   Number of children: 1   Years of education: Not on file   Highest education level: Not on file  Occupational History   Not on file  Tobacco Use   Smoking status: Never   Smokeless tobacco: Never  Vaping Use   Vaping Use: Never used  Substance and Sexual Activity   Alcohol use: Never   Drug use: Never   Sexual activity: Not on file  Other Topics Concern   Not on file  Social History Narrative  Not on file   Social Determinants of Health   Financial Resource Strain: Medium Risk (06/23/2021)   Overall Financial Resource Strain (CARDIA)    Difficulty of Paying Living Expenses: Somewhat hard  Food Insecurity: No Food Insecurity (06/23/2021)   Hunger Vital Sign    Worried About Running Out of Food in the Last Year: Never true    Ran Out of Food in the Last Year: Never true  Transportation Needs: No Transportation Needs (06/23/2021)   PRAPARE - Hydrologist (Medical): No    Lack of Transportation (Non-Medical): No  Physical Activity: Not on file  Stress: Not on file  Social Connections: Not on file  Intimate Partner Violence: Not on file     OBSERVATIONS/OBJECTIVE:  BP  (!) 157/75 (BP Location: Left Arm, Patient Position: Sitting)   Pulse 72   Temp (!) 97 F (36.1 C) (Temporal)   Resp 16   Wt 185 lb 8 oz (84.1 kg)   SpO2 95%   BMI 31.84 kg/m  GENERAL: Patient is a well appearing female in no acute distress HEENT:  Sclerae anicteric.  Oropharynx clear and moist. No ulcerations or evidence of oropharyngeal candidiasis. Neck is supple.  NODES:  No cervical, supraclavicular, or axillary lymphadenopathy palpated.  BREAST EXAM: Right breast status postlumpectomy and radiation no sign of local recurrence left breast is benign. LUNGS:  Clear to auscultation bilaterally.  No wheezes or rhonchi. HEART:  Regular rate and rhythm. No murmur appreciated. ABDOMEN:  Soft, nontender.  Positive, normoactive bowel sounds. No organomegaly palpated. MSK:  No focal spinal tenderness to palpation. Full range of motion bilaterally in the upper extremities. EXTREMITIES:  No peripheral edema.   SKIN:  Clear with no obvious rashes or skin changes. No nail dyscrasia. NEURO:  Nonfocal. Well oriented.  Appropriate affect.   LABORATORY DATA:  None for this visit.  DIAGNOSTIC IMAGING:  None for this visit.      ASSESSMENT AND PLAN:  Ms.. Hoover is a pleasant 66 y.o. female with Stage 1A right breast invasive ductal carcinoma, ER+/PR+/HER2-, diagnosed in April 2023, treated with lumpectomy, adjuvant radiation therapy, and anti-estrogen therapy with anastrozole beginning in August 2023.  She presents to the Survivorship Clinic for our initial meeting and routine follow-up post-completion of treatment for breast cancer.    1. Stage 1A right breast cancer:  Alexandria Hoover is continuing to recover from definitive treatment for breast cancer. She will follow-up with her medical oncologist, Dr. Chryl Heck in 03/2022 with history and physical exam per surveillance protocol.  She will continue her anti-estrogen therapy with Anastrozole. Thus far, she is tolerating the Anastrozole well, with  minimal side effects. She was instructed to make Dr. Lindi Adie or myself aware if she begins to experience any worsening side effects of the medication and I could see her back in clinic to help manage those side effects, as needed. Her mammogram is due 03/2022; orders placed today. Today, a comprehensive survivorship care plan and treatment summary was reviewed with the patient today detailing her breast cancer diagnosis, treatment course, potential late/long-term effects of treatment, appropriate follow-up care with recommendations for the future, and patient education resources.  A copy of this summary, along with a letter will be sent to the patient's primary care provider via mail/fax/In Basket message after today's visit.    2. Bone health:  Given Ms. Grunden's age/history of breast cancer and her current treatment regimen including anti-estrogen therapy with Anastrozole, she is at risk for bone  demineralization.  Her last DEXA scan was completed in October 2023 and was normal with a T score -0.2 in the right femur.  She was recommended to repeat bone density testing in 2 years. She was given education on specific activities to promote bone health.  #. Cancer screening:  Due to Ms. Gass's history and her age, she should receive screening for skin cancers, colon cancer.  The information and recommendations are listed on the patient's comprehensive care plan/treatment summary and were reviewed in detail with the patient.    #. Health maintenance and wellness promotion: Ms. Musquiz was encouraged to consume 5-7 servings of fruits and vegetables per day. We reviewed the "Nutrition Rainbow" handout.  She was also encouraged to engage in moderate to vigorous exercise for 30 minutes per day most days of the week.  Goal: to consistently walk 30 minutes three times a week by 03/2022 appt with Dr. Chryl Heck.   We discussed the [employer] fitness program, which is designed for cancer survivors to help them become  more physically fit after cancer treatments.  She was instructed to limit her alcohol consumption and continue to abstain from tobacco use.     #. Support services/counseling: It is not uncommon for this period of the patient's cancer care trajectory to be one of many emotions and stressors.   She was given information regarding our available services and encouraged to contact me with any questions or for help enrolling in any of our support group/programs.    Follow up instructions:   Goal: to consistently walk 30 minutes three times a week by 03/2022 appt with Dr. Chryl Heck.   -Return to cancer center 03/2022 -Mammogram due in February 2024 -Bone density testing in October 2025 -She is welcome to return back to the Survivorship Clinic at any time; no additional follow-up needed at this time.  -Consider referral back to survivorship as a long-term survivor for continued surveillance  The patient was provided an opportunity to ask questions and all were answered. The patient agreed with the plan and demonstrated an understanding of the instructions.   Total encounter time:40 minutes*in face-to-face visit time, chart review, lab review, care coordination, order entry, and documentation of the encounter time.  Wilber Bihari, NP 12/30/21 9:43 AM Medical Oncology and Hematology [facility] [place], [place] [address] Tel. [phone]    Fax. [phone]  *Total Encounter Time as defined by the Centers for Medicare and Medicaid Services includes, in addition to the face-to-face time of a patient visit (documented in the note above) non-face-to-face time: obtaining and reviewing outside history, ordering and reviewing medications, tests or procedures, care coordination (communications with other health care professionals or caregivers) and documentation in the medical record.

## 2021-12-31 ENCOUNTER — Other Ambulatory Visit: Payer: Self-pay | Admitting: Adult Health

## 2021-12-31 ENCOUNTER — Telehealth: Payer: Self-pay | Admitting: Adult Health

## 2021-12-31 NOTE — Telephone Encounter (Signed)
Scheduled appointment per 11/17 los. Left voicemail.

## 2022-01-11 ENCOUNTER — Encounter: Payer: Self-pay | Admitting: Hematology and Oncology

## 2022-01-31 ENCOUNTER — Ambulatory Visit: Payer: Commercial Managed Care - HMO | Attending: General Surgery

## 2022-01-31 VITALS — Wt 183.5 lb

## 2022-01-31 DIAGNOSIS — Z483 Aftercare following surgery for neoplasm: Secondary | ICD-10-CM | POA: Insufficient documentation

## 2022-01-31 NOTE — Therapy (Signed)
  OUTPATIENT PHYSICAL THERAPY SOZO SCREENING NOTE   Patient Name: Alexandria Hoover MRN: [id number] DOB:[birth year], 66 y.o., female Today's Date: 01/31/2022  PCP: Patient, No Pcp Per REFERRING PROVIDER: Rolm Bookbinder, MD   PT End of Session - 01/31/22 [phone]     Visit Number 5   # unchanged due to screen only   PT Start Time 0809    PT Stop Time 0815    PT Time Calculation (min) 6 min    Activity Tolerance Patient tolerated treatment well    Behavior During Therapy [facility] [place] for tasks assessed/performed             Past Medical History:  Diagnosis Date   Breast cancer ([place]) 2023   Diabetes ([place])    type 2   Heart murmur    History of radiation therapy    Right breast 08/16/21-09/08/21-Dr. Gery Pray   Hypercholesteremia    Hypertension    Past Surgical History:  Procedure Laterality Date   ABDOMINAL HYSTERECTOMY     BREAST BIOPSY Right 06/11/2021   BREAST EXCISIONAL BIOPSY Right over 10 yrs   BREAST LUMPECTOMY WITH RADIOACTIVE SEED AND SENTINEL LYMPH NODE BIOPSY Right 07/06/2021   Procedure: RIGHT BREAST LUMPECTOMY WITH RADIOACTIVE SEED AND AXILLARY SENTINEL LYMPH NODE BIOPSY;  Surgeon: Rolm Bookbinder, MD;  Location: [facility];  Service: General;  Laterality: Right;   TUBAL LIGATION     WISDOM TOOTH EXTRACTION     Patient Active Problem List   Diagnosis Date Noted   Genetic testing 06/30/2021   Family history of breast cancer 06/24/2021   Family history of prostate cancer 06/24/2021   Malignant neoplasm of upper-outer quadrant of right breast in female, estrogen receptor positive ([place]) 06/21/2021    REFERRING DIAG: right breast cancer at risk for lymphedema  THERAPY DIAG: Aftercare following surgery for neoplasm  PERTINENT HISTORY: Patient was diagnosed on 04/12/2021 with right grade 1 invasive ductal carcinoma breast cancer. It measures 1.7 cm and is located in the upper outer quadrant. It is ER/PR positive and HER2 negative with a Ki67 of 2%.  Also has hypertension and diabetes. Pt had lumpectomy with 0/3 sentinel nodes removed on 07/06/2021 . Pt will be starting radiation on 08/02/2021 and completed on 09/08/21.   PRECAUTIONS: right UE Lymphedema risk, None  SUBJECTIVE: Pt returns for her first 3 month L-Dex screen .  PAIN:  Are you having pain? No  SOZO SCREENING: Patient was assessed today using the SOZO machine to determine the lymphedema index score. This was compared to her baseline score. It was determined that she is within the recommended range when compared to her baseline and no further action is needed at this time. She will continue SOZO screenings. These are done every 3 months for 2 years post operatively followed by every 6 months for 2 years, and then annually.   L-DEX FLOWSHEETS - 01/31/22 0800       L-DEX LYMPHEDEMA SCREENING   Measurement Type Unilateral    L-DEX MEASUREMENT EXTREMITY Upper Extremity    POSITION  Standing    DOMINANT SIDE Right    At Risk Side Right    BASELINE SCORE (UNILATERAL) 0    L-DEX SCORE (UNILATERAL) -3    VALUE CHANGE (UNILAT) -3               Otelia Limes, PTA 01/31/2022, 8:13 AM

## 2022-03-28 ENCOUNTER — Telehealth: Payer: Self-pay | Admitting: Hematology and Oncology

## 2022-03-28 NOTE — Telephone Encounter (Signed)
Left patient a vm regarding appointment change  

## 2022-03-30 ENCOUNTER — Telehealth: Payer: Self-pay | Admitting: Hematology and Oncology

## 2022-03-30 NOTE — Telephone Encounter (Signed)
2/14 @ 10:25 am return patient call to confirm appt for 2/16, she left voicemail for RadOnc. Appt is with Dr. Chryl Heck in [place] for this Friday.

## 2022-04-01 ENCOUNTER — Encounter: Payer: Self-pay | Admitting: *Deleted

## 2022-04-01 ENCOUNTER — Inpatient Hospital Stay: Payer: 59 | Attending: Adult Health | Admitting: Hematology and Oncology

## 2022-04-01 ENCOUNTER — Other Ambulatory Visit: Payer: Self-pay

## 2022-04-01 ENCOUNTER — Inpatient Hospital Stay: Payer: 59 | Admitting: Hematology and Oncology

## 2022-04-01 VITALS — BP 128/67 | HR 68 | Temp 97.9°F | Resp 16 | Ht 64.0 in | Wt 184.0 lb

## 2022-04-01 DIAGNOSIS — Z800 Family history of malignant neoplasm of digestive organs: Secondary | ICD-10-CM | POA: Insufficient documentation

## 2022-04-01 DIAGNOSIS — C50411 Malignant neoplasm of upper-outer quadrant of right female breast: Secondary | ICD-10-CM | POA: Insufficient documentation

## 2022-04-01 DIAGNOSIS — Z803 Family history of malignant neoplasm of breast: Secondary | ICD-10-CM | POA: Insufficient documentation

## 2022-04-01 DIAGNOSIS — Z79811 Long term (current) use of aromatase inhibitors: Secondary | ICD-10-CM | POA: Diagnosis not present

## 2022-04-01 DIAGNOSIS — Z79899 Other long term (current) drug therapy: Secondary | ICD-10-CM | POA: Diagnosis not present

## 2022-04-01 DIAGNOSIS — Z888 Allergy status to other drugs, medicaments and biological substances status: Secondary | ICD-10-CM | POA: Diagnosis not present

## 2022-04-01 DIAGNOSIS — Z885 Allergy status to narcotic agent status: Secondary | ICD-10-CM | POA: Diagnosis not present

## 2022-04-01 DIAGNOSIS — Z808 Family history of malignant neoplasm of other organs or systems: Secondary | ICD-10-CM | POA: Insufficient documentation

## 2022-04-01 DIAGNOSIS — Z90710 Acquired absence of both cervix and uterus: Secondary | ICD-10-CM | POA: Diagnosis not present

## 2022-04-01 DIAGNOSIS — Z809 Family history of malignant neoplasm, unspecified: Secondary | ICD-10-CM | POA: Insufficient documentation

## 2022-04-01 DIAGNOSIS — Z923 Personal history of irradiation: Secondary | ICD-10-CM | POA: Insufficient documentation

## 2022-04-01 DIAGNOSIS — Z8042 Family history of malignant neoplasm of prostate: Secondary | ICD-10-CM | POA: Diagnosis not present

## 2022-04-01 DIAGNOSIS — Z5986 Financial insecurity: Secondary | ICD-10-CM | POA: Diagnosis not present

## 2022-04-01 DIAGNOSIS — Z170 Estrogen receptor positive status [ER+]: Secondary | ICD-10-CM | POA: Insufficient documentation

## 2022-04-01 NOTE — Progress Notes (Signed)
[facility] CONSULT NOTE  Patient Care Team: Patient, No Pcp Per as PCP - General (General Practice) Rolm Bookbinder, MD as Consulting Physician (General Surgery) Benay Pike, MD as Consulting Physician (Hematology and Oncology) Gery Pray, MD as Consulting Physician (Radiation Oncology)  CHIEF COMPLAINTS/PURPOSE OF CONSULTATION:  Newly diagnosed breast cancer  HISTORY OF PRESENTING ILLNESS:  Alexandria Hoover 67 y.o. female is here because of recent diagnosis of right IDC breast cancer  I reviewed her records extensively and collaborated the history with the patient.  SUMMARY OF ONCOLOGIC HISTORY: Oncology History  Malignant neoplasm of upper-outer quadrant of right breast in female, estrogen receptor positive ([place])  04/12/2021 Mammogram   Mammogram from April 12, 2021 showed possible distortion in the right breast and additional imaging was recommended. Diagnostic mammogram confirmed indeterminate architectural distortion involving the upper outer quadrant of the right breast at middle depth adjacent to a group of benign calcifications.  Stereotactic tomosynthesis core needle biopsy of the right breast was recommended   06/11/2021 Pathology Results   Pathology from the breast biopsy showed invasive mammary carcinoma ductal subtype, grade 1.  Prognostic showed ER 100% positive strong staining PR 100% positive strong staining, KI of 2% and HER2 by FISH pending   06/21/2021 Initial Diagnosis   Malignant neoplasm of upper-outer quadrant of right breast in female, estrogen receptor positive ([place])   06/23/2021 Cancer Staging   Staging form: Breast, AJCC 8th Edition - Clinical stage from 06/23/2021: Stage IA (cT1c, cN0, cM0, G1, ER+, PR+, HER2-) - Signed by Benay Pike, MD on 06/23/2021 Stage prefix: Initial diagnosis Histologic grading system: 3 grade system Laterality: Right Staged by: Pathologist and managing physician Stage used in treatment planning:  Yes National guidelines used in treatment planning: Yes Type of national guideline used in treatment planning: NCCN    Genetic Testing   [facility] CustomNext Panel was Negative. Report date is 07/05/2021.  The CustomNext-Cancer+RNAinsight panel offered by [facility] includes sequencing and rearrangement analysis for the following 47 genes:  APC, ATM, AXIN2, BARD1, BMPR1A, BRCA1, BRCA2, BRIP1, CDH1, CDK4, CDKN2A, CHEK2, CTNNA1, DICER1, EPCAM, GREM1, HOXB13, KIT, MEN1, MLH1, MSH2, MSH3, MSH6, MUTYH, NBN, NF1, NTHL1, PALB2, PDGFRA, PMS2, POLD1, POLE, PTEN, RAD50, RAD51C, RAD51D, SDHA, SDHB, SDHC, SDHD, SMAD4, SMARCA4, STK11, TP53, TSC1, TSC2, and VHL.  RNA data is routinely analyzed for use in variant interpretation for all genes.   07/06/2021 Pathology Results   Pathology from right breast lumpectomy showed 1.5 mm grade 1 IDC, negative margins, all lymph nodes benign, all margins clear, prior prognostic showed ER 100% positive strong staining PR 100% positive strong staining, Ki-67 of 2% and HER2 negative   07/06/2021 Surgery   RIGHT BREAST, LUMPECTOMY: pT1a, pN0   08/16/2021 - 09/08/2021 Radiation Therapy   Site Technique Total Dose (Gy) Dose per Fx (Gy) Completed Fx Beam Energies  Breast, Right: Breast_R 3D 42.72/42.72 2.67 16/16 10X     09/2021 -  Anti-estrogen oral therapy   Anastrozole    Interval history  Patient is here for follow-up by herself.   She completed radiation end of July. She is tolerating anastrozole extremely well.  She denies any complaints today at all. Rest of the pertinent 10 point ROS reviewed and negative  MEDICAL HISTORY:  Past Medical History:  Diagnosis Date   Breast cancer ([place]) 2023   Diabetes ([place])    type 2   Heart murmur    History of radiation therapy    Right breast 08/16/21-09/08/21-Dr. Gery Pray   Hypercholesteremia  Hypertension     SURGICAL HISTORY: Past Surgical History:  Procedure Laterality Date   ABDOMINAL HYSTERECTOMY      BREAST BIOPSY Right 06/11/2021   BREAST EXCISIONAL BIOPSY Right over 10 yrs   BREAST LUMPECTOMY WITH RADIOACTIVE SEED AND SENTINEL LYMPH NODE BIOPSY Right 07/06/2021   Procedure: RIGHT BREAST LUMPECTOMY WITH RADIOACTIVE SEED AND AXILLARY SENTINEL LYMPH NODE BIOPSY;  Surgeon: Rolm Bookbinder, MD;  Location: [facility];  Service: General;  Laterality: Right;   TUBAL LIGATION     WISDOM TOOTH EXTRACTION      SOCIAL HISTORY: Social History   Socioeconomic History   Marital status: Divorced    Spouse name: Not on file   Number of children: 1   Years of education: Not on file   Highest education level: Not on file  Occupational History   Not on file  Tobacco Use   Smoking status: Never   Smokeless tobacco: Never  Vaping Use   Vaping Use: Never used  Substance and Sexual Activity   Alcohol use: Never   Drug use: Never   Sexual activity: Not on file  Other Topics Concern   Not on file  Social History Narrative   Not on file   Social Determinants of Health   Financial Resource Strain: Medium Risk (06/23/2021)   Overall Financial Resource Strain (CARDIA)    Difficulty of Paying Living Expenses: Somewhat hard  Food Insecurity: No Food Insecurity (06/23/2021)   Hunger Vital Sign    Worried About Running Out of Food in the Last Year: Never true    Ran Out of Food in the Last Year: Never true  Transportation Needs: No Transportation Needs (06/23/2021)   PRAPARE - Hydrologist (Medical): No    Lack of Transportation (Non-Medical): No  Physical Activity: Not on file  Stress: Not on file  Social Connections: Not on file  Intimate Partner Violence: Not on file    FAMILY HISTORY: Family History  Problem Relation Age of Onset   Throat cancer Father    Cancer Sister    Breast cancer Sister 79   Pancreatic cancer Brother        maternal half-brother   Throat cancer Brother        maternal half-brother   Breast cancer Paternal Aunt     Prostate cancer Paternal Uncle    Prostate cancer Cousin     ALLERGIES:  is allergic to codeine and lisinopril.  MEDICATIONS:  Current Outpatient Medications  Medication Sig Dispense Refill   amLODipine (NORVASC) 10 MG tablet Take 10 mg by mouth daily.     anastrozole (ARIMIDEX) 1 MG tablet Take 1 tablet (1 mg total) by mouth daily. 90 tablet 3   Cholecalciferol (VITAMIN D3) 250 MCG (10000 UT) capsule 10,000 Units daily.     loratadine (CLARITIN) 10 MG tablet Take 10 mg by mouth daily as needed for allergies.     losartan-hydrochlorothiazide (HYZAAR) 100-25 MG tablet Take 1 tablet by mouth daily.     metFORMIN (GLUCOPHAGE-XR) 500 MG 24 hr tablet 500 mg daily with breakfast.     pravastatin (PRAVACHOL) 40 MG tablet 1 tablet     No current facility-administered medications for this visit.    REVIEW OF SYSTEMS:   Constitutional: Denies fevers, chills or abnormal night sweats Eyes: Denies blurriness of vision, double vision or watery eyes Ears, nose, mouth, throat, and face: Denies mucositis or sore throat Respiratory: Denies cough, dyspnea or wheezes Cardiovascular: Denies palpitation,  chest discomfort or lower extremity swelling Gastrointestinal:  Denies nausea, heartburn or change in bowel habits Skin: Denies abnormal skin rashes Lymphatics: Denies new lymphadenopathy or easy bruising Neurological:Denies numbness, tingling or new weaknesses Behavioral/Psych: Mood is stable, no new changes  Breast: Denies any palpable lumps or discharge All other systems were reviewed with the patient and are negative.  PHYSICAL EXAMINATION: ECOG PERFORMANCE STATUS: 0 - Asymptomatic  Vitals:   04/01/22 1152  BP: 128/67  Pulse: 68  Resp: 16  Temp: 97.9 F (36.6 C)  SpO2: 96%   Filed Weights   04/01/22 1152  Weight: 184 lb (83.5 kg)    Physical Exam Constitutional:      Appearance: Normal appearance.  Chest:       Comments: Bilateral breasts inspected and palpated.  Post surgical  and radiation changes with dark skin noted on the right breast.  No other palpable masses.  No regional adenopathy.  Left breast normal to inspection and palpation. Neurological:     Mental Status: She is alert.      LABORATORY DATA:  I have reviewed the data as listed Lab Results  Component Value Date   WBC 7.0 06/23/2021   HGB 12.3 06/23/2021   HCT 39.5 06/23/2021   MCV 77.3 (L) 06/23/2021   PLT 361 06/23/2021   Lab Results  Component Value Date   NA 140 07/02/2021   K 3.3 (L) 07/02/2021   CL 103 07/02/2021   CO2 27 07/02/2021    RADIOGRAPHIC STUDIES: I have personally reviewed the radiological reports and agreed with the findings in the report.  ASSESSMENT AND PLAN:  Malignant neoplasm of upper-outer quadrant of right breast in female, estrogen receptor positive ([place]) This is a very pleasant 67 year old postmenopausal female patient with newly diagnosed right breast invasive ductal carcinoma, grade 1 ER/PR positive, KI of 2%, HER2 FISH pending referred to breast [facility] for recommendations.  Given small tumor, architectural distortion measuring about 1.7 cm, strong ER/PR positivity, HER2 FISH negative, she underwent lumpectomy upfront.  This showed final pathology with 1.5 mm grade 1 IDC, negative margins, all lymph nodes benign.  Given tumor of 5 mm, I have not recommended any Oncotype testing. She is now post adjuvant radiation therapy. She is on antiestrogen therapy with anastrozole and has been tolerating it remarkably well. Baseline bone density from October 2023 with T-score of -0.1 considered normal. No concerns on exam today.  She will proceed with mammogram as scheduled.  I have given her the phone number to call and schedule the breast exam at the breast center. She will otherwise return to clinic in 1 year or sooner as needed.  She will follow-up with Dr. Donne Hazel as recommended. Total time spent: 30 minutes  All questions were answered. The patient knows to call the  clinic with any problems, questions or concerns.    Benay Pike, MD 04/01/22

## 2022-04-01 NOTE — Assessment & Plan Note (Signed)
This is a very pleasant 67 year old postmenopausal female patient with newly diagnosed right breast invasive ductal carcinoma, grade 1 ER/PR positive, KI of 2%, HER2 FISH pending referred to breast [facility] for recommendations.  Given small tumor, architectural distortion measuring about 1.7 cm, strong ER/PR positivity, HER2 FISH negative, she underwent lumpectomy upfront.  This showed final pathology with 1.5 mm grade 1 IDC, negative margins, all lymph nodes benign.  Given tumor of 5 mm, I have not recommended any Oncotype testing. She is now post adjuvant radiation therapy. She is on antiestrogen therapy with anastrozole and has been tolerating it remarkably well. Baseline bone density from October 2023 with T-score of -0.1 considered normal. No concerns on exam today.  She will proceed with mammogram as scheduled.  I have given her the phone number to call and schedule the breast exam at the breast center. She will otherwise return to clinic in 1 year or sooner as needed.  She will follow-up with Dr. Donne Hazel as recommended.

## 2022-04-12 DIAGNOSIS — E1165 Type 2 diabetes mellitus with hyperglycemia: Secondary | ICD-10-CM | POA: Diagnosis not present

## 2022-04-12 DIAGNOSIS — E7800 Pure hypercholesterolemia, unspecified: Secondary | ICD-10-CM | POA: Diagnosis not present

## 2022-04-12 DIAGNOSIS — Z1159 Encounter for screening for other viral diseases: Secondary | ICD-10-CM | POA: Diagnosis not present

## 2022-04-12 DIAGNOSIS — Z23 Encounter for immunization: Secondary | ICD-10-CM | POA: Diagnosis not present

## 2022-04-12 DIAGNOSIS — I10 Essential (primary) hypertension: Secondary | ICD-10-CM | POA: Diagnosis not present

## 2022-04-12 DIAGNOSIS — E559 Vitamin D deficiency, unspecified: Secondary | ICD-10-CM | POA: Diagnosis not present

## 2022-04-12 DIAGNOSIS — Z0000 Encounter for general adult medical examination without abnormal findings: Secondary | ICD-10-CM | POA: Diagnosis not present

## 2022-04-12 DIAGNOSIS — C50411 Malignant neoplasm of upper-outer quadrant of right female breast: Secondary | ICD-10-CM | POA: Diagnosis not present

## 2022-04-15 ENCOUNTER — Ambulatory Visit
Admission: RE | Admit: 2022-04-15 | Discharge: 2022-04-15 | Disposition: A | Discharge status: Home / Self Care | Payer: 59 | Source: Ambulatory Visit | Attending: Adult Health | Admitting: Adult Health

## 2022-04-15 DIAGNOSIS — R921 Mammographic calcification found on diagnostic imaging of breast: Secondary | ICD-10-CM | POA: Diagnosis not present

## 2022-04-15 DIAGNOSIS — C50411 Malignant neoplasm of upper-outer quadrant of right female breast: Secondary | ICD-10-CM

## 2022-04-15 HISTORY — DX: Personal history of irradiation: Z92.3

## 2022-05-01 DIAGNOSIS — E119 Type 2 diabetes mellitus without complications: Secondary | ICD-10-CM | POA: Diagnosis not present

## 2022-05-09 ENCOUNTER — Ambulatory Visit: Payer: 59 | Attending: General Surgery

## 2022-05-09 VITALS — Wt 186.1 lb

## 2022-05-09 DIAGNOSIS — Z483 Aftercare following surgery for neoplasm: Secondary | ICD-10-CM | POA: Insufficient documentation

## 2022-05-09 NOTE — Therapy (Signed)
  OUTPATIENT PHYSICAL THERAPY [facility] SCREENING NOTE   Patient Name: Alexandria Hoover MRN: [id number] DOB:1955/12/14, 67 y.o., female Today's Date: 05/09/2022  PCP: Patient, No Pcp Per REFERRING PROVIDER: Rolm Bookbinder, MD   PT End of Session - 05/09/22 0758     Visit Number 5   # unchanged due to screen only   PT Start Time [id number]    PT Stop Time 0759    PT Time Calculation (min) 4 min    Activity Tolerance Patient tolerated treatment well    Behavior During Therapy [facility] for tasks assessed/performed             Past Medical History:  Diagnosis Date   Breast cancer () 2023   Diabetes ([place])    type 2   Heart murmur    History of radiation therapy    Right breast 08/16/21-09/08/21-Dr. Gery Pray   Hypercholesteremia    Hypertension    Personal history of radiation therapy    Past Surgical History:  Procedure Laterality Date   ABDOMINAL HYSTERECTOMY     BREAST BIOPSY Right 06/11/2021   BREAST EXCISIONAL BIOPSY Right over 10 yrs   BREAST LUMPECTOMY     BREAST LUMPECTOMY WITH RADIOACTIVE SEED AND SENTINEL LYMPH NODE BIOPSY Right 07/06/2021   Procedure: RIGHT BREAST LUMPECTOMY WITH RADIOACTIVE SEED AND AXILLARY SENTINEL LYMPH NODE BIOPSY;  Surgeon: Rolm Bookbinder, MD;  Location: [facility];  Service: General;  Laterality: Right;   TUBAL LIGATION     WISDOM TOOTH EXTRACTION     Patient Active Problem List   Diagnosis Date Noted   Genetic testing 06/30/2021   Family history of breast cancer 06/24/2021   Family history of prostate cancer 06/24/2021   Malignant neoplasm of upper-outer quadrant of right breast in female, estrogen receptor positive ([place]) 06/21/2021    REFERRING DIAG: right breast cancer at risk for lymphedema  THERAPY DIAG: Aftercare following surgery for neoplasm  PERTINENT HISTORY: Patient was diagnosed on 04/12/2021 with right grade 1 invasive ductal carcinoma breast cancer. It measures 1.7 cm and is located in the upper outer  quadrant. It is ER/PR positive and HER2 negative with a Ki67 of 2%. Also has hypertension and diabetes. Pt had lumpectomy with 0/3 sentinel nodes removed on 07/06/2021 . Pt will be starting radiation on 08/02/2021 and completed on 09/08/21.   PRECAUTIONS: right UE Lymphedema risk, None  SUBJECTIVE: Pt returns for her 3 month L-Dex screen .  PAIN:  Are you having pain? No  SOZO SCREENING: Patient was assessed today using the SOZO machine to determine the lymphedema index score. This was compared to her baseline score. It was determined that she is within the recommended range when compared to her baseline and no further action is needed at this time. She will continue SOZO screenings. These are done every 3 months for 2 years post operatively followed by every 6 months for 2 years, and then annually.   L-DEX FLOWSHEETS - 05/09/22 0700       L-DEX LYMPHEDEMA SCREENING   Measurement Type Unilateral    L-DEX MEASUREMENT EXTREMITY Upper Extremity    POSITION  Standing    DOMINANT SIDE Right    At Risk Side Right    BASELINE SCORE (UNILATERAL) 0    L-DEX SCORE (UNILATERAL) -4.1    VALUE CHANGE (UNILAT) -4.1               Alexandria Hoover, PTA 05/09/2022, 7:59 AM

## 2022-08-08 ENCOUNTER — Ambulatory Visit: Payer: 59 | Attending: General Surgery

## 2022-08-08 VITALS — Wt 187.0 lb

## 2022-08-08 DIAGNOSIS — Z483 Aftercare following surgery for neoplasm: Secondary | ICD-10-CM | POA: Insufficient documentation

## 2022-08-08 NOTE — Therapy (Signed)
  OUTPATIENT PHYSICAL THERAPY [facility] SCREENING NOTE   Patient Name: Alexandria Hoover MRN: [id number] DOB:October 15, 1955, 67 y.o., female Today's Date: 08/08/2022  PCP: Patient, No Pcp Per REFERRING PROVIDER: Emelia Loron, MD   PT End of Session - 08/08/22 0827     Visit Number 5   # unchanged due to screen only   PT Start Time 0825    PT Stop Time 0830    PT Time Calculation (min) 5 min    Activity Tolerance Patient tolerated treatment well    Behavior During Therapy [facility] for tasks assessed/performed             Past Medical History:  Diagnosis Date   Breast cancer ([facility]) 2023   Diabetes ([facility])    type 2   Heart murmur    History of radiation therapy    Right breast 08/16/21-09/08/21-Dr. Antony Blackbird   Hypercholesteremia    Hypertension    Personal history of radiation therapy    Past Surgical History:  Procedure Laterality Date   ABDOMINAL HYSTERECTOMY     BREAST BIOPSY Right 06/11/2021   BREAST EXCISIONAL BIOPSY Right over 10 yrs   BREAST LUMPECTOMY     BREAST LUMPECTOMY WITH RADIOACTIVE SEED AND SENTINEL LYMPH NODE BIOPSY Right 07/06/2021   Procedure: RIGHT BREAST LUMPECTOMY WITH RADIOACTIVE SEED AND AXILLARY SENTINEL LYMPH NODE BIOPSY;  Surgeon: Emelia Loron, MD;  Location: [facility];  Service: General;  Laterality: Right;   TUBAL LIGATION     WISDOM TOOTH EXTRACTION     Patient Active Problem List   Diagnosis Date Noted   Genetic testing 06/30/2021   Family history of breast cancer 06/24/2021   Family history of prostate cancer 06/24/2021   Malignant neoplasm of upper-outer quadrant of right breast in female, estrogen receptor positive ([facility]) 06/21/2021    REFERRING DIAG: right breast cancer at risk for lymphedema  THERAPY DIAG: Aftercare following surgery for neoplasm  PERTINENT HISTORY: Patient was diagnosed on 04/12/2021 with right grade 1 invasive ductal carcinoma breast cancer. It measures 1.7 cm and is located in the upper outer  quadrant. It is ER/PR positive and HER2 negative with a Ki67 of 2%. Also has hypertension and diabetes. Pt had lumpectomy with 0/3 sentinel nodes removed on 07/06/2021 . Pt will be starting radiation on 08/02/2021 and completed on 09/08/21.   PRECAUTIONS: right UE Lymphedema risk, None  SUBJECTIVE: Pt returns for her 3 month L-Dex screen .  PAIN:  Are you having pain? No  SOZO SCREENING: Patient was assessed today using the SOZO machine to determine the lymphedema index score. This was compared to her baseline score. It was determined that she is within the recommended range when compared to her baseline and no further action is needed at this time. She will continue SOZO screenings. These are done every 3 months for 2 years post operatively followed by every 6 months for 2 years, and then annually.   L-DEX FLOWSHEETS - 08/08/22 0800       L-DEX LYMPHEDEMA SCREENING   Measurement Type Unilateral    L-DEX MEASUREMENT EXTREMITY Upper Extremity    POSITION  Standing    DOMINANT SIDE Right    At Risk Side Right    BASELINE SCORE (UNILATERAL) 0    L-DEX SCORE (UNILATERAL) -4.9    VALUE CHANGE (UNILAT) -4.9               Hermenia Bers, PTA 08/08/2022, 8:29 AM

## 2022-09-30 ENCOUNTER — Encounter: Payer: Self-pay | Admitting: *Deleted

## 2022-09-30 ENCOUNTER — Inpatient Hospital Stay: Payer: 59 | Attending: Adult Health | Admitting: Adult Health

## 2022-09-30 ENCOUNTER — Other Ambulatory Visit: Payer: Self-pay

## 2022-09-30 ENCOUNTER — Encounter: Payer: Self-pay | Admitting: Adult Health

## 2022-09-30 VITALS — BP 139/63 | HR 65 | Temp 97.5°F | Resp 18 | Ht 64.0 in | Wt 190.1 lb

## 2022-09-30 DIAGNOSIS — Z803 Family history of malignant neoplasm of breast: Secondary | ICD-10-CM | POA: Diagnosis not present

## 2022-09-30 DIAGNOSIS — Z170 Estrogen receptor positive status [ER+]: Secondary | ICD-10-CM | POA: Diagnosis not present

## 2022-09-30 DIAGNOSIS — R232 Flushing: Secondary | ICD-10-CM | POA: Insufficient documentation

## 2022-09-30 DIAGNOSIS — N63 Unspecified lump in unspecified breast: Secondary | ICD-10-CM | POA: Diagnosis not present

## 2022-09-30 DIAGNOSIS — Z79899 Other long term (current) drug therapy: Secondary | ICD-10-CM | POA: Diagnosis not present

## 2022-09-30 DIAGNOSIS — Z5986 Financial insecurity: Secondary | ICD-10-CM | POA: Insufficient documentation

## 2022-09-30 DIAGNOSIS — Z809 Family history of malignant neoplasm, unspecified: Secondary | ICD-10-CM | POA: Insufficient documentation

## 2022-09-30 DIAGNOSIS — Z90710 Acquired absence of both cervix and uterus: Secondary | ICD-10-CM | POA: Diagnosis not present

## 2022-09-30 DIAGNOSIS — Z79811 Long term (current) use of aromatase inhibitors: Secondary | ICD-10-CM | POA: Diagnosis not present

## 2022-09-30 DIAGNOSIS — Z8042 Family history of malignant neoplasm of prostate: Secondary | ICD-10-CM | POA: Diagnosis not present

## 2022-09-30 DIAGNOSIS — C50411 Malignant neoplasm of upper-outer quadrant of right female breast: Secondary | ICD-10-CM | POA: Diagnosis not present

## 2022-09-30 NOTE — Progress Notes (Signed)
[facility] Cancer Follow up:    Camie Patience, FNP [address] [place] [address]   DIAGNOSIS:  Cancer Staging  Malignant neoplasm of upper-outer quadrant of right breast in female, estrogen receptor positive (HCC) Staging form: Breast, AJCC 8th Edition - Clinical stage from 06/23/2021: Stage IA (cT1c, cN0, cM0, G1, ER+, PR+, HER2-) - Signed by Rachel Moulds, MD on 06/23/2021 Stage prefix: Initial diagnosis Histologic grading system: 3 grade system Laterality: Right Staged by: Pathologist and managing physician Stage used in treatment planning: Yes National guidelines used in treatment planning: Yes Type of national guideline used in treatment planning: NCCN   SUMMARY OF ONCOLOGIC HISTORY: Oncology History  Malignant neoplasm of upper-outer quadrant of right breast in female, estrogen receptor positive (HCC)  04/12/2021 Mammogram   Mammogram from April 12, 2021 showed possible distortion in the right breast and additional imaging was recommended. Diagnostic mammogram confirmed indeterminate architectural distortion involving the upper outer quadrant of the right breast at middle depth adjacent to a group of benign calcifications.  Stereotactic tomosynthesis core needle biopsy of the right breast was recommended   06/11/2021 Pathology Results   Pathology from the breast biopsy showed invasive mammary carcinoma ductal subtype, grade 1.  Prognostic showed ER 100% positive strong staining PR 100% positive strong staining, KI of 2% and HER2 by FISH pending   06/21/2021 Initial Diagnosis   Malignant neoplasm of upper-outer quadrant of right breast in female, estrogen receptor positive (HCC)   06/23/2021 Cancer Staging   Staging form: Breast, AJCC 8th Edition - Clinical stage from 06/23/2021: Stage IA (cT1c, cN0, cM0, G1, ER+, PR+, HER2-) - Signed by Rachel Moulds, MD on 06/23/2021 Stage prefix: Initial diagnosis Histologic grading system: 3 grade  system Laterality: Right Staged by: Pathologist and managing physician Stage used in treatment planning: Yes National guidelines used in treatment planning: Yes Type of national guideline used in treatment planning: NCCN    Genetic Testing   [facility] CustomNext Panel was Negative. Report date is 07/05/2021.  The CustomNext-Cancer+RNAinsight panel offered by [employer] includes sequencing and rearrangement analysis for the following 47 genes:  APC, ATM, AXIN2, BARD1, BMPR1A, BRCA1, BRCA2, BRIP1, CDH1, CDK4, CDKN2A, CHEK2, CTNNA1, DICER1, EPCAM, GREM1, HOXB13, KIT, MEN1, MLH1, MSH2, MSH3, MSH6, MUTYH, NBN, NF1, NTHL1, PALB2, PDGFRA, PMS2, POLD1, POLE, PTEN, RAD50, RAD51C, RAD51D, SDHA, SDHB, SDHC, SDHD, SMAD4, SMARCA4, STK11, TP53, TSC1, TSC2, and VHL.  RNA data is routinely analyzed for use in variant interpretation for all genes.   07/06/2021 Pathology Results   Pathology from right breast lumpectomy showed 1.5 mm grade 1 IDC, negative margins, all lymph nodes benign, all margins clear, prior prognostic showed ER 100% positive strong staining PR 100% positive strong staining, Ki-67 of 2% and HER2 negative   07/06/2021 Surgery   RIGHT BREAST, LUMPECTOMY: pT1a, pN0   08/16/2021 - 09/08/2021 Radiation Therapy   Site Technique Total Dose (Gy) Dose per Fx (Gy) Completed Fx Beam Energies  Breast, Right: Breast_R 3D 42.72/42.72 2.67 16/16 10X     09/2021 -  Anti-estrogen oral therapy   Anastrozole     CURRENT THERAPY: Anastrozole  INTERVAL HISTORY: Alexandria Hoover 67 y.o. female returns for follow-up of her breast cancer currently on treatment with Anastrozole.  She has some hot flashes on the anastrozole however these are minimal and tolerable.  Her most recent mammogram occurred on April 15, 2022 demonstrating no mammographic evidence of malignancy and breast density category C.  Also underwent bone density testing  on November 19, 2021 that was normal.  Helga tells me that she has a new area  at her right breast lumpectomy site that is more firm and has been worsening over the past 4 weeks.  She is seeing her primary care provider regularly and tells me that she is not exercising but plans to do so.    Patient Active Problem List   Diagnosis Date Noted   Genetic testing 06/30/2021   Family history of breast cancer 06/24/2021   Family history of prostate cancer 06/24/2021   Malignant neoplasm of upper-outer quadrant of right breast in female, estrogen receptor positive (HCC) 06/21/2021    is allergic to codeine and lisinopril.  MEDICAL HISTORY: Past Medical History:  Diagnosis Date   Breast cancer (HCC) 2023   Diabetes (HCC)    type 2   Heart murmur    History of radiation therapy    Right breast 08/16/21-09/08/21-Dr. Antony Blackbird   Hypercholesteremia    Hypertension    Personal history of radiation therapy     SURGICAL HISTORY: Past Surgical History:  Procedure Laterality Date   ABDOMINAL HYSTERECTOMY     BREAST BIOPSY Right 06/11/2021   BREAST EXCISIONAL BIOPSY Right over 10 yrs   BREAST LUMPECTOMY     BREAST LUMPECTOMY WITH RADIOACTIVE SEED AND SENTINEL LYMPH NODE BIOPSY Right 07/06/2021   Procedure: RIGHT BREAST LUMPECTOMY WITH RADIOACTIVE SEED AND AXILLARY SENTINEL LYMPH NODE BIOPSY;  Surgeon: Emelia Loron, MD;  Location: [facility];  Service: General;  Laterality: Right;   TUBAL LIGATION     WISDOM TOOTH EXTRACTION      SOCIAL HISTORY: Social History   Socioeconomic History   Marital status: Divorced    Spouse name: Not on file   Number of children: 1   Years of education: Not on file   Highest education level: Not on file  Occupational History   Not on file  Tobacco Use   Smoking status: Never   Smokeless tobacco: Never  Vaping Use   Vaping status: Never Used  Substance and Sexual Activity   Alcohol use: Never   Drug use: Never   Sexual activity: Not on file  Other Topics Concern   Not on file  Social History  Narrative   Not on file   Social Determinants of Health   Financial Resource Strain: Medium Risk (06/23/2021)   Overall Financial Resource Strain (CARDIA)    Difficulty of Paying Living Expenses: Somewhat hard  Food Insecurity: No Food Insecurity (06/23/2021)   Hunger Vital Sign    Worried About Running Out of Food in the Last Year: Never true    Ran Out of Food in the Last Year: Never true  Transportation Needs: No Transportation Needs (06/23/2021)   PRAPARE - Administrator, Civil Service (Medical): No    Lack of Transportation (Non-Medical): No  Physical Activity: Not on file  Stress: Not on file  Social Connections: Unknown (07/11/2021)   [facility] from [facility]   Social Network    Social Network: Not on file  Intimate Partner Violence: Unknown (07/11/2021)   Received from [facility]   HITS    Physically Hurt: Not on file    Insult or Talk Down To: Not on file    Threaten Physical Harm: Not on file    Scream or Curse: Not on file    FAMILY HISTORY: Family History  Problem Relation Age of Onset   Throat cancer Father    Cancer Sister  Breast cancer Sister 61   Pancreatic cancer Brother        maternal half-brother   Throat cancer Brother        maternal half-brother   Breast cancer Paternal Aunt    Prostate cancer Paternal Uncle    Prostate cancer Cousin     Review of Systems  Constitutional:  Negative for appetite change, chills, fatigue, fever and unexpected weight change.  HENT:   Negative for hearing loss, lump/mass and trouble swallowing.   Eyes:  Negative for eye problems and icterus.  Respiratory:  Negative for chest tightness, cough and shortness of breath.   Cardiovascular:  Negative for chest pain, leg swelling and palpitations.  Gastrointestinal:  Negative for abdominal distention, abdominal pain, constipation, diarrhea, nausea and vomiting.  Endocrine: Positive for hot flashes.  Genitourinary:  Negative for difficulty urinating.    Musculoskeletal:  Negative for arthralgias.  Skin:  Negative for itching and rash.  Neurological:  Negative for dizziness, extremity weakness, headaches and numbness.  Hematological:  Negative for adenopathy. Does not bruise/bleed easily.  Psychiatric/Behavioral:  Negative for depression. The patient is not nervous/anxious.       PHYSICAL EXAMINATION    Vitals:   09/30/22 0912  BP: 139/63  Pulse: 65  Resp: 18  Temp: (!) 97.5 F (36.4 C)  SpO2: 95%    Physical Exam Constitutional:      General: She is not in acute distress.    Appearance: Normal appearance. She is not toxic-appearing.  HENT:     Head: Normocephalic and atraumatic.     Mouth/Throat:     Mouth: Mucous membranes are moist.     Pharynx: Oropharynx is clear. No oropharyngeal exudate or posterior oropharyngeal erythema.  Eyes:     General: No scleral icterus. Cardiovascular:     Rate and Rhythm: Normal rate and regular rhythm.     Pulses: Normal pulses.     Heart sounds: Normal heart sounds.  Pulmonary:     Effort: Pulmonary effort is normal.     Breath sounds: Normal breath sounds.  Chest:     Comments: Left breast is benign right breast status postlumpectomy and radiation, there is a flat nodular area at her lumpectomy site. Abdominal:     General: Abdomen is flat. Bowel sounds are normal. There is no distension.     Palpations: Abdomen is soft.     Tenderness: There is no abdominal tenderness.  Musculoskeletal:        General: No swelling.     Cervical back: Neck supple.  Lymphadenopathy:     Cervical: No cervical adenopathy.  Skin:    General: Skin is warm and dry.     Findings: No rash.  Neurological:     General: No focal deficit present.     Mental Status: She is alert.  Psychiatric:        Mood and Affect: Mood normal.        Behavior: Behavior normal.     LABORATORY DATA:  CBC    Component Value Date/Time   WBC 7.0 06/23/2021 0816   RBC 5.11 06/23/2021 0816   HGB 12.3  06/23/2021 0816   HCT 39.5 06/23/2021 0816   PLT 361 06/23/2021 0816   MCV 77.3 (L) 06/23/2021 0816   MCH 24.1 (L) 06/23/2021 0816   MCHC 31.1 06/23/2021 0816   RDW 15.6 (H) 06/23/2021 0816   LYMPHSABS 2.4 06/23/2021 0816   MONOABS 0.5 06/23/2021 0816   EOSABS 0.4 06/23/2021 0816  BASOSABS 0.0 06/23/2021 0816    CMP     Component Value Date/Time   NA 140 07/02/2021 1500   K 3.3 (L) 07/02/2021 1500   CL 103 07/02/2021 1500   CO2 27 07/02/2021 1500   GLUCOSE 110 (H) 07/02/2021 1500   BUN 22 07/02/2021 1500   CREATININE 1.00 07/02/2021 1500   CREATININE 1.00 06/23/2021 0816   CALCIUM 10.3 07/02/2021 1500   PROT 8.4 (H) 06/23/2021 0816   ALBUMIN 4.6 06/23/2021 0816   AST 15 06/23/2021 0816   ALT 19 06/23/2021 0816   ALKPHOS 68 06/23/2021 0816   BILITOT 0.4 06/23/2021 0816   GFRNONAA >60 07/02/2021 1500   GFRNONAA >60 06/23/2021 0816   ASSESSMENT and THERAPY PLAN:   Malignant neoplasm of upper-outer quadrant of right breast in female, estrogen receptor positive (HCC) Davona is a 67 year old woman with 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  Stage Ia breast cancer: She continues on anastrozole with good tolerance.  I recommended that she continue taking this.  She will continue with annual lateral breast mammograms next due in May 2025. Right breast change: I ordered a diagnostic mammogram and ultrasound to further evaluate. Bone health: Her most recent bone density in October 2023 was normal.  Repeat is recommended in October 2025. Health maintenance: She is recommended to continue seeing her primary care provider regularly.  She is not currently exercising however by her next appointment she has set a goal to be exercising at least 3 times a week.  I gave her a handout on healthy diet and exercise today from the  [employer] of lifestyle medicine.  She will return in 6 months for follow-up with Dr. Al Pimple and I will see her back in 1 year.  She knows to call for any questions or concerns that may arise between now and her next visit.  All questions were answered. The patient knows to call the clinic with any problems, questions or concerns. We can certainly see the patient much sooner if necessary.  Total encounter time:30 minutes*in face-to-face visit time, chart review, lab review, care coordination, order entry, and documentation of the encounter time.  Lillard Anes, NP 09/30/22 11:27 AM Medical Oncology and Hematology [facility] [address] [place], [place] [address] Tel. [phone]    Fax. [phone]  *Total Encounter Time as defined by the Centers for Medicare and Medicaid Services includes, in addition to the face-to-face time of a patient visit (documented in the note above) non-face-to-face time: obtaining and reviewing outside history, ordering and reviewing medications, tests or procedures, care coordination (communications with other health care professionals or caregivers) and documentation in the medical record.

## 2022-09-30 NOTE — Assessment & Plan Note (Signed)
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  Stage Ia breast cancer: She continues on anastrozole with good tolerance.  I recommended that she continue taking this.  She will continue with annual lateral breast mammograms next due in May 2025. Right breast change: I ordered a diagnostic mammogram and ultrasound to further evaluate. Bone health: Her most recent bone density in October 2023 was normal.  Repeat is recommended in October 2025. Health maintenance: She is recommended to continue seeing her primary care provider regularly.  She is not currently exercising however by her next appointment she has set a goal to be exercising at least 3 times a week.  I gave her a handout on healthy diet and exercise today from the [employer] of lifestyle medicine.  She will return in 6 months for follow-up with Dr. Al Pimple and I will see her back in 1 year.  She knows to call for any questions or concerns that may arise between now and her next visit.

## 2022-10-06 ENCOUNTER — Other Ambulatory Visit: Payer: Self-pay | Admitting: *Deleted

## 2022-10-06 MED ORDER — ANASTROZOLE 1 MG PO TABS: 1.0000 mg | ORAL_TABLET | Freq: Every day | ORAL | 3 refills | Status: DC

## 2022-10-11 DIAGNOSIS — Z23 Encounter for immunization: Secondary | ICD-10-CM | POA: Diagnosis not present

## 2022-10-11 DIAGNOSIS — E7800 Pure hypercholesterolemia, unspecified: Secondary | ICD-10-CM | POA: Diagnosis not present

## 2022-10-11 DIAGNOSIS — I10 Essential (primary) hypertension: Secondary | ICD-10-CM | POA: Diagnosis not present

## 2022-10-11 DIAGNOSIS — C50411 Malignant neoplasm of upper-outer quadrant of right female breast: Secondary | ICD-10-CM | POA: Diagnosis not present

## 2022-10-11 DIAGNOSIS — E1165 Type 2 diabetes mellitus with hyperglycemia: Secondary | ICD-10-CM | POA: Diagnosis not present

## 2022-10-19 ENCOUNTER — Ambulatory Visit
Admission: RE | Admit: 2022-10-19 | Discharge: 2022-10-19 | Disposition: A | Discharge status: Home / Self Care | Payer: 59 | Source: Ambulatory Visit | Attending: Adult Health | Admitting: Adult Health

## 2022-10-19 DIAGNOSIS — Z853 Personal history of malignant neoplasm of breast: Secondary | ICD-10-CM | POA: Diagnosis not present

## 2022-10-19 DIAGNOSIS — Z170 Estrogen receptor positive status [ER+]: Secondary | ICD-10-CM

## 2022-10-19 DIAGNOSIS — N63 Unspecified lump in unspecified breast: Secondary | ICD-10-CM

## 2022-10-19 DIAGNOSIS — Z9889 Other specified postprocedural states: Secondary | ICD-10-CM | POA: Diagnosis not present

## 2022-10-19 DIAGNOSIS — R928 Other abnormal and inconclusive findings on diagnostic imaging of breast: Secondary | ICD-10-CM | POA: Diagnosis not present

## 2022-10-20 ENCOUNTER — Other Ambulatory Visit: Payer: Self-pay | Admitting: Adult Health

## 2022-10-20 DIAGNOSIS — N6001 Solitary cyst of right breast: Secondary | ICD-10-CM

## 2022-10-20 DIAGNOSIS — C50411 Malignant neoplasm of upper-outer quadrant of right female breast: Secondary | ICD-10-CM

## 2022-10-20 DIAGNOSIS — N6310 Unspecified lump in the right breast, unspecified quadrant: Secondary | ICD-10-CM

## 2022-11-07 ENCOUNTER — Ambulatory Visit: Payer: 59 | Attending: General Surgery

## 2022-11-07 VITALS — Wt 193.4 lb

## 2022-11-07 DIAGNOSIS — Z483 Aftercare following surgery for neoplasm: Secondary | ICD-10-CM | POA: Insufficient documentation

## 2022-11-07 NOTE — Therapy (Signed)
OUTPATIENT PHYSICAL THERAPY SOZO SCREENING NOTE   Patient Name: Alexandria Hoover MRN: [id number] DOB:05-20-1955, 67 y.o., female Today's Date: 11/07/2022  PCP: Camie Patience, FNP REFERRING PROVIDER: Emelia Loron, MD   PT End of Session - 11/07/22 0848     Visit Number 5   # unchanged due to screen only   PT Start Time 0846    PT Stop Time 0850    PT Time Calculation (min) 4 min    Activity Tolerance Patient tolerated treatment well    Behavior During Therapy [facility] for tasks assessed/performed             Past Medical History:  Diagnosis Date   Breast cancer (HCC) 2023   Diabetes (HCC)    type 2   Heart murmur    History of radiation therapy    Right breast 08/16/21-09/08/21-Dr. Antony Blackbird   Hypercholesteremia    Hypertension    Personal history of radiation therapy    Past Surgical History:  Procedure Laterality Date   ABDOMINAL HYSTERECTOMY     BREAST BIOPSY Right 06/11/2021   BREAST EXCISIONAL BIOPSY Right over 10 yrs   BREAST LUMPECTOMY     BREAST LUMPECTOMY WITH RADIOACTIVE SEED AND SENTINEL LYMPH NODE BIOPSY Right 07/06/2021   Procedure: RIGHT BREAST LUMPECTOMY WITH RADIOACTIVE SEED AND AXILLARY SENTINEL LYMPH NODE BIOPSY;  Surgeon: Emelia Loron, MD;  Location: [facility];  Service: General;  Laterality: Right;   TUBAL LIGATION     WISDOM TOOTH EXTRACTION     Patient Active Problem List   Diagnosis Date Noted   Genetic testing 06/30/2021   Family history of breast cancer 06/24/2021   Family history of prostate cancer 06/24/2021   Malignant neoplasm of upper-outer quadrant of right breast in female, estrogen receptor positive (HCC) 06/21/2021    REFERRING DIAG: right breast cancer at risk for lymphedema  THERAPY DIAG: Aftercare following surgery for neoplasm  PERTINENT HISTORY: Patient was diagnosed on 04/12/2021 with right grade 1 invasive ductal carcinoma breast cancer. It measures 1.7 cm and is located in the upper outer  quadrant. It is ER/PR positive and HER2 negative with a Ki67 of 2%. Also has hypertension and diabetes. Pt had lumpectomy with 0/3 sentinel nodes removed on 07/06/2021 . Pt will be starting radiation on 08/02/2021 and completed on 09/08/21.   PRECAUTIONS: right UE Lymphedema risk, None  SUBJECTIVE: Pt returns for her 3 month L-Dex screen .  PAIN:  Are you having pain? No  SOZO SCREENING: Patient was assessed today using the SOZO machine to determine the lymphedema index score. This was compared to her baseline score. It was determined that she is within the recommended range when compared to her baseline and no further action is needed at this time. She will continue SOZO screenings. These are done every 3 months for 2 years post operatively followed by every 6 months for 2 years, and then annually.   L-DEX FLOWSHEETS - 11/07/22 0800       L-DEX LYMPHEDEMA SCREENING   Measurement Type Unilateral    L-DEX MEASUREMENT EXTREMITY Upper Extremity    POSITION  Standing    DOMINANT SIDE Right    At Risk Side Right    BASELINE SCORE (UNILATERAL) 0    L-DEX SCORE (UNILATERAL) -5.9    VALUE CHANGE (UNILAT) -5.9               Hermenia Bers, PTA 11/07/2022, 8:49 AM

## 2023-01-16 IMAGING — MG MM BREAST SURGICAL SPECIMEN
1 series · 2 of 2 positions shown · non-contrast
Comparison: None Available.

CLINICAL DATA: Evaluate specimen

EXAM:
SPECIMEN RADIOGRAPH OF THE RIGHT BREAST

[Series 1: R · right · 0.07mm/px · 2 of 2 slices shown]
[im 1/2]
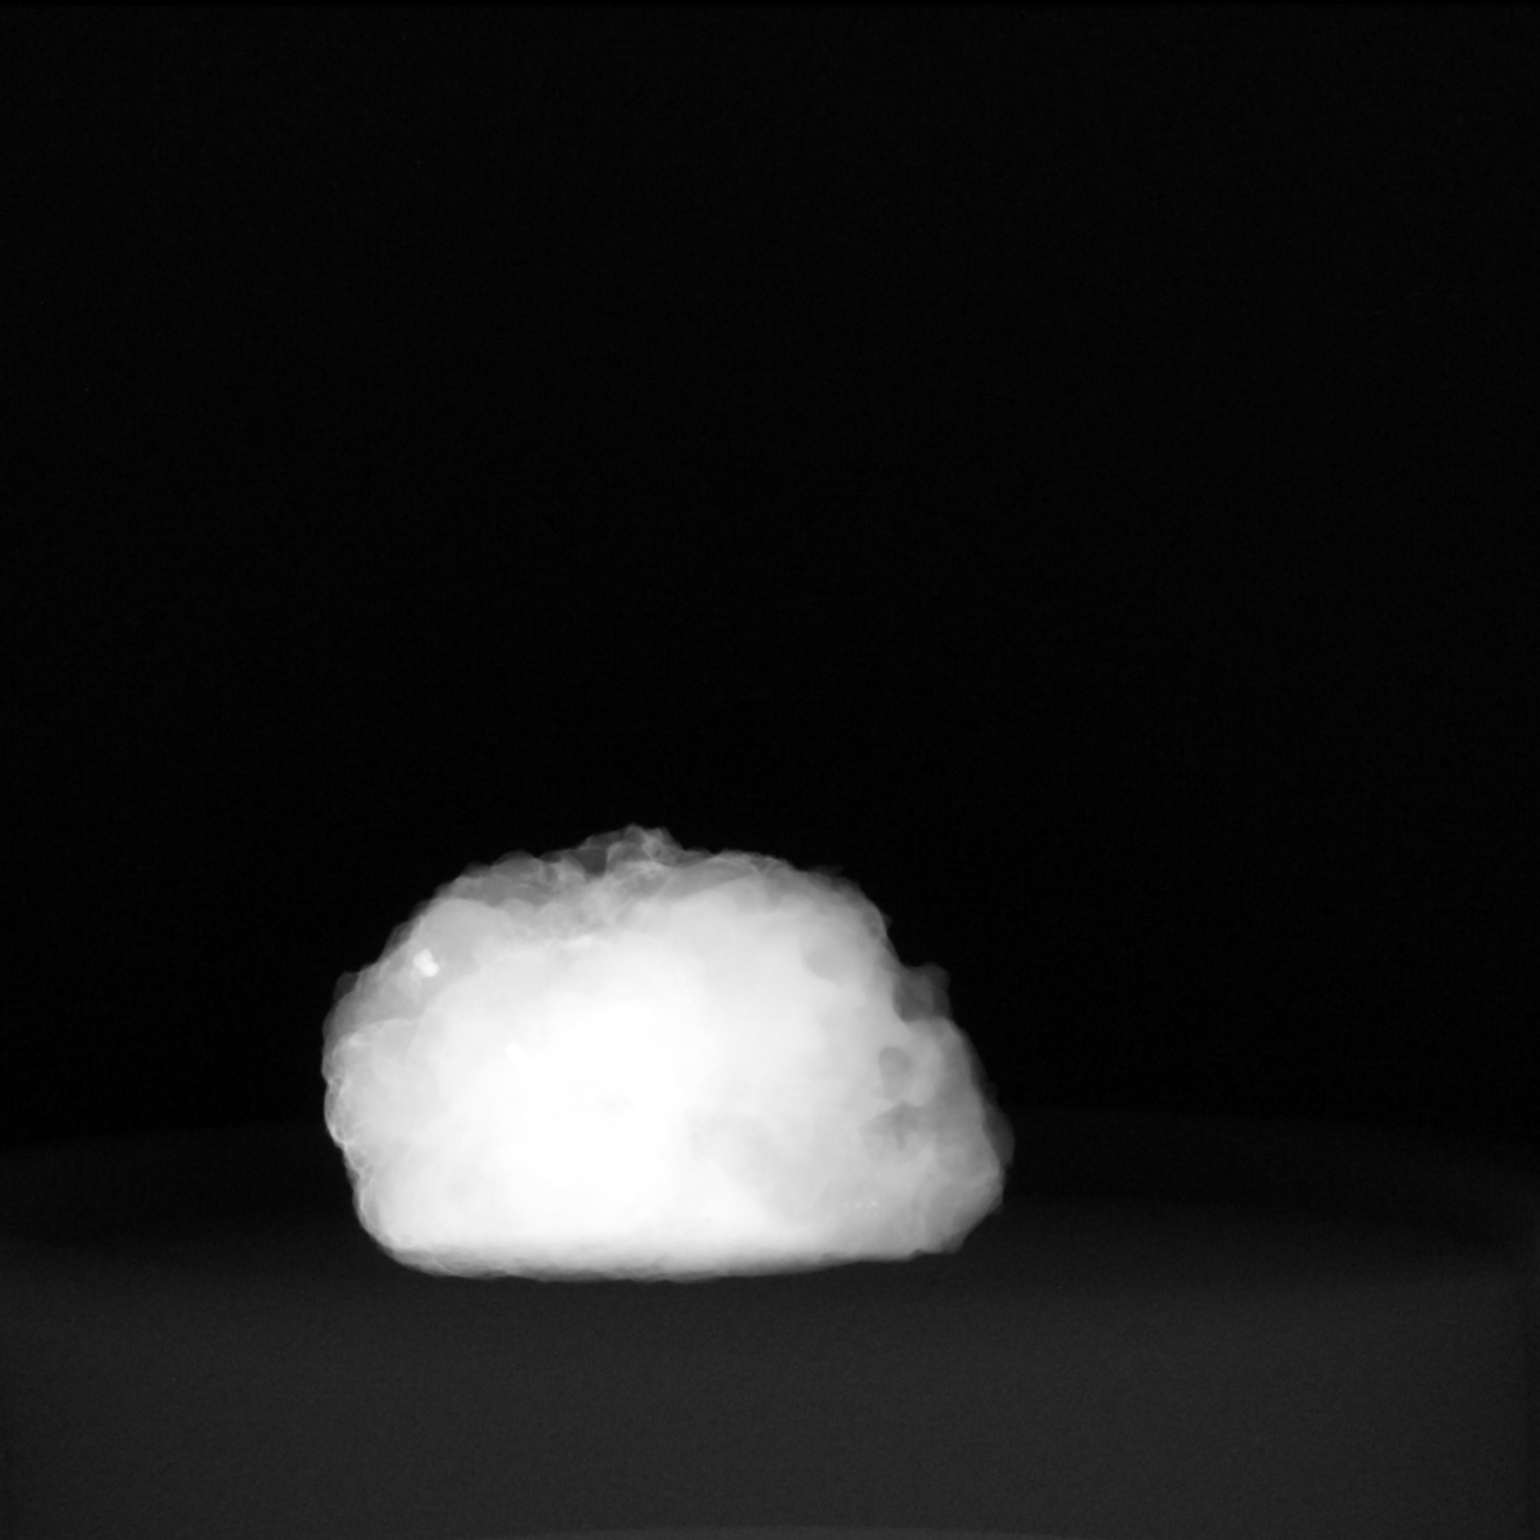
[im 2/2]
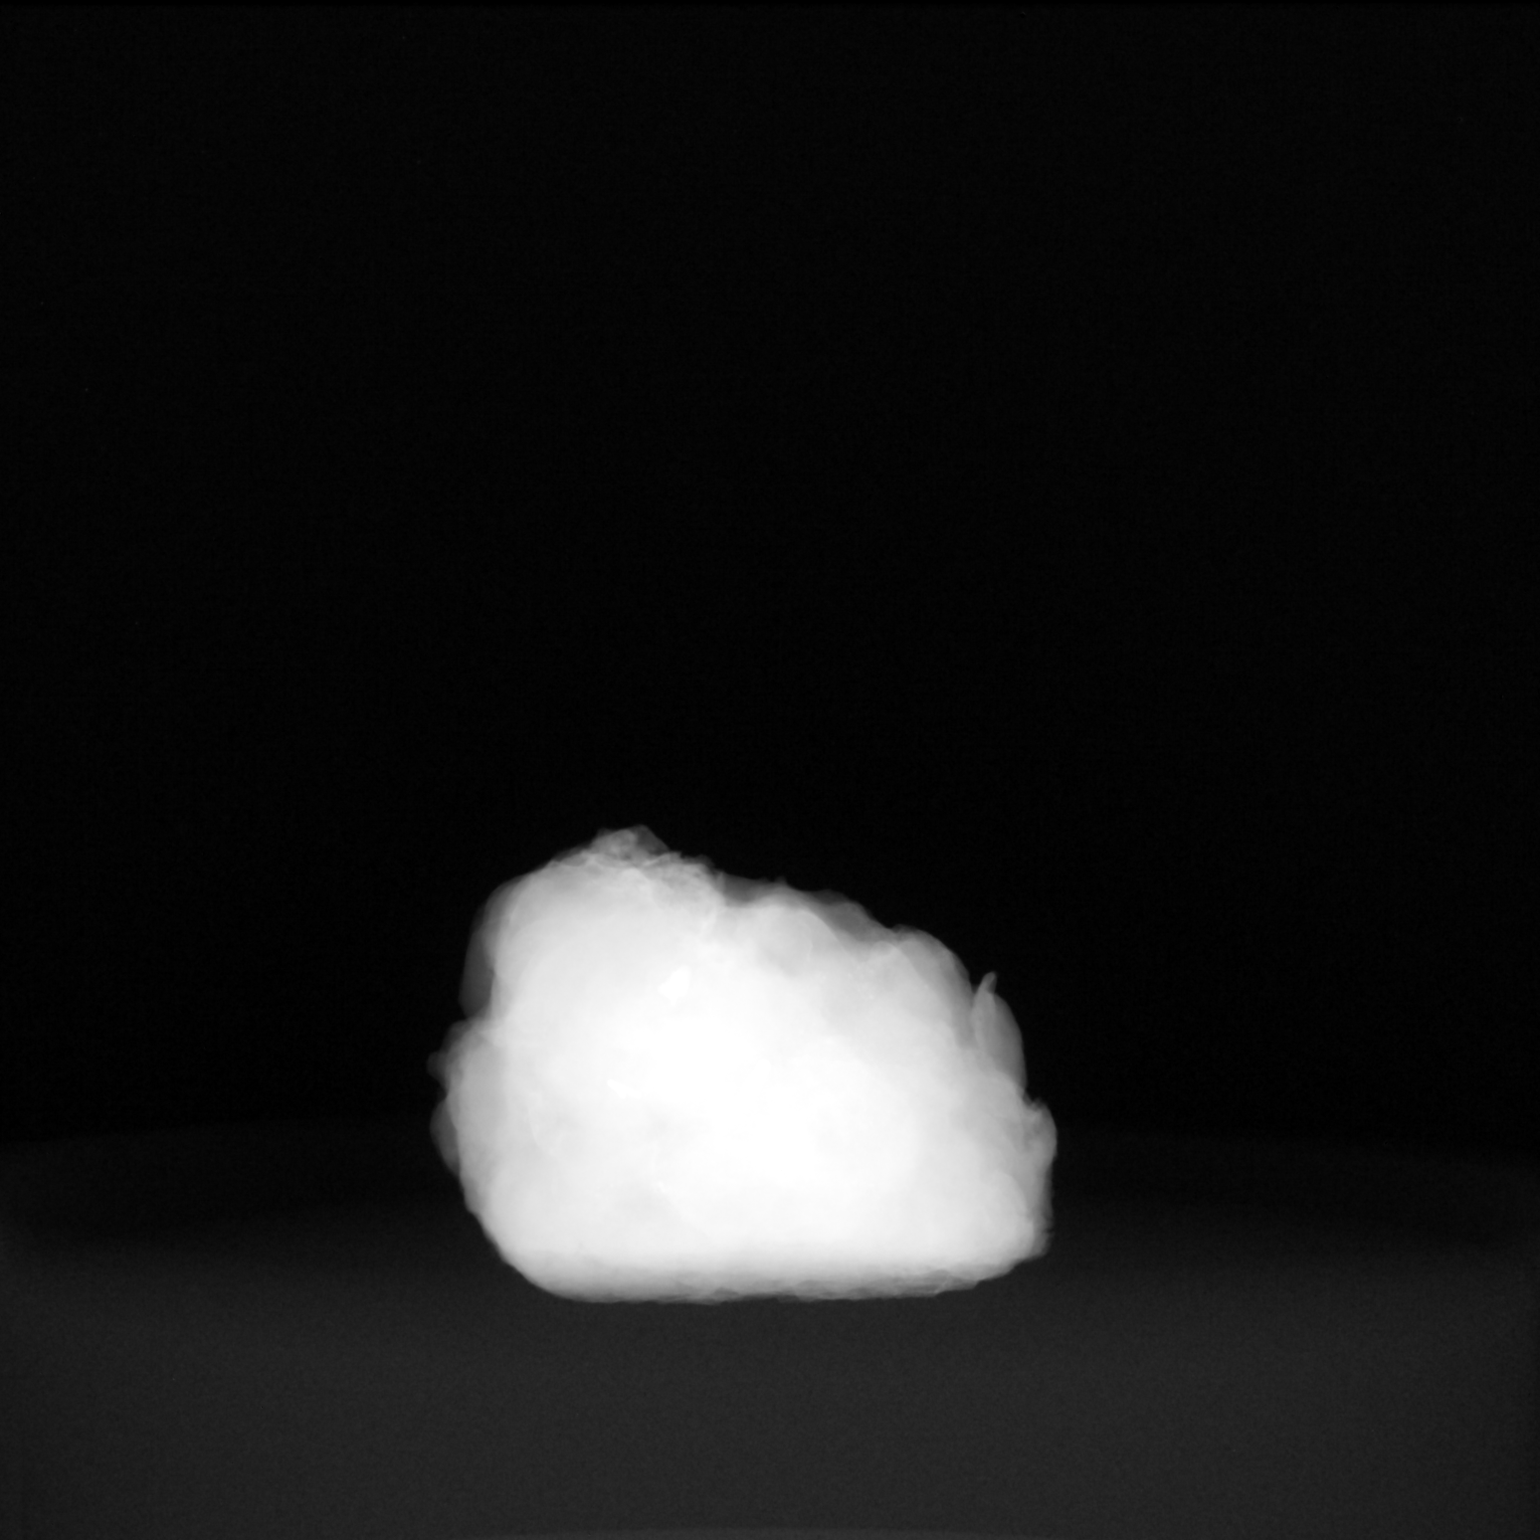

[2 of 2 positions shown; findings below may reference images not displayed]

FINDINGS: Status post excision of the right breast. The radioactive seed and
biopsy marker clip are present, completely intact, and were marked
for pathology.
IMPRESSION: Specimen radiograph of the right breast.

## 2023-01-20 ENCOUNTER — Ambulatory Visit
Admission: RE | Admit: 2023-01-20 | Discharge: 2023-01-20 | Disposition: A | Discharge status: Home / Self Care | Payer: 59 | Source: Ambulatory Visit | Attending: Adult Health | Admitting: Adult Health

## 2023-01-20 ENCOUNTER — Other Ambulatory Visit: Payer: Self-pay | Admitting: Adult Health

## 2023-01-20 DIAGNOSIS — N6310 Unspecified lump in the right breast, unspecified quadrant: Secondary | ICD-10-CM

## 2023-01-20 DIAGNOSIS — Z853 Personal history of malignant neoplasm of breast: Secondary | ICD-10-CM | POA: Diagnosis not present

## 2023-01-20 DIAGNOSIS — N6315 Unspecified lump in the right breast, overlapping quadrants: Secondary | ICD-10-CM | POA: Diagnosis not present

## 2023-01-27 DIAGNOSIS — C50911 Malignant neoplasm of unspecified site of right female breast: Secondary | ICD-10-CM | POA: Diagnosis not present

## 2023-01-31 DIAGNOSIS — C50911 Malignant neoplasm of unspecified site of right female breast: Secondary | ICD-10-CM | POA: Diagnosis not present

## 2023-02-02 DIAGNOSIS — C50911 Malignant neoplasm of unspecified site of right female breast: Secondary | ICD-10-CM | POA: Diagnosis not present

## 2023-02-06 ENCOUNTER — Ambulatory Visit: Payer: 59 | Attending: General Surgery

## 2023-02-06 VITALS — Wt 192.1 lb

## 2023-02-06 DIAGNOSIS — Z483 Aftercare following surgery for neoplasm: Secondary | ICD-10-CM | POA: Insufficient documentation

## 2023-02-06 NOTE — Therapy (Signed)
  OUTPATIENT PHYSICAL THERAPY SOZO SCREENING NOTE   Patient Name: Alexandria Hoover MRN: [id number] DOB:Jun 16, 1955, 67 y.o., female Today's Date: 02/06/2023  PCP: Camie Patience, FNP REFERRING PROVIDER: Emelia Loron, MD   PT End of Session - 02/06/23 0845     Visit Number 5   # unchanged due to screen only   PT Start Time 0843    PT Stop Time 0847    PT Time Calculation (min) 4 min    Activity Tolerance Patient tolerated treatment well    Behavior During Therapy [facility] for tasks assessed/performed             Past Medical History:  Diagnosis Date   Breast cancer (HCC) 2023   Diabetes (HCC)    type 2   Heart murmur    History of radiation therapy    Right breast 08/16/21-09/08/21-Dr. Antony Blackbird   Hypercholesteremia    Hypertension    Personal history of radiation therapy    Past Surgical History:  Procedure Laterality Date   ABDOMINAL HYSTERECTOMY     BREAST BIOPSY Right 06/11/2021   BREAST EXCISIONAL BIOPSY Right over 10 yrs   BREAST LUMPECTOMY     BREAST LUMPECTOMY WITH RADIOACTIVE SEED AND SENTINEL LYMPH NODE BIOPSY Right 07/06/2021   Procedure: RIGHT BREAST LUMPECTOMY WITH RADIOACTIVE SEED AND AXILLARY SENTINEL LYMPH NODE BIOPSY;  Surgeon: Emelia Loron, MD;  Location: [facility];  Service: General;  Laterality: Right;   TUBAL LIGATION     WISDOM TOOTH EXTRACTION     Patient Active Problem List   Diagnosis Date Noted   Genetic testing 06/30/2021   Family history of breast cancer 06/24/2021   Family history of prostate cancer 06/24/2021   Malignant neoplasm of upper-outer quadrant of right breast in female, estrogen receptor positive (HCC) 06/21/2021    REFERRING DIAG: right breast cancer at risk for lymphedema  THERAPY DIAG: Aftercare following surgery for neoplasm  PERTINENT HISTORY: Patient was diagnosed on 04/12/2021 with right grade 1 invasive ductal carcinoma breast cancer. It measures 1.7 cm and is located in the upper outer  quadrant. It is ER/PR positive and HER2 negative with a Ki67 of 2%. Also has hypertension and diabetes. Pt had lumpectomy with 0/3 sentinel nodes removed on 07/06/2021 . Pt will be starting radiation on 08/02/2021 and completed on 09/08/21.   PRECAUTIONS: right UE Lymphedema risk, None  SUBJECTIVE: Pt returns for her 3 month L-Dex screen .  PAIN:  Are you having pain? No  SOZO SCREENING: Patient was assessed today using the SOZO machine to determine the lymphedema index score. This was compared to her baseline score. It was determined that she is within the recommended range when compared to her baseline and no further action is needed at this time. She will continue SOZO screenings. These are done every 3 months for 2 years post operatively followed by every 6 months for 2 years, and then annually.   L-DEX FLOWSHEETS - 02/06/23 0800       L-DEX LYMPHEDEMA SCREENING   Measurement Type Unilateral    L-DEX MEASUREMENT EXTREMITY Upper Extremity    POSITION  Standing    DOMINANT SIDE Right    At Risk Side Right    BASELINE SCORE (UNILATERAL) 0    L-DEX SCORE (UNILATERAL) -4.9    VALUE CHANGE (UNILAT) -4.9               Hermenia Bers, PTA 02/06/2023, 8:46 AM

## 2023-02-07 DIAGNOSIS — C50911 Malignant neoplasm of unspecified site of right female breast: Secondary | ICD-10-CM | POA: Diagnosis not present

## 2023-02-27 DIAGNOSIS — R051 Acute cough: Secondary | ICD-10-CM | POA: Diagnosis not present

## 2023-02-27 DIAGNOSIS — J069 Acute upper respiratory infection, unspecified: Secondary | ICD-10-CM | POA: Diagnosis not present

## 2023-02-27 DIAGNOSIS — E1165 Type 2 diabetes mellitus with hyperglycemia: Secondary | ICD-10-CM | POA: Diagnosis not present

## 2023-04-03 ENCOUNTER — Encounter: Payer: Self-pay | Admitting: Hematology and Oncology

## 2023-04-03 ENCOUNTER — Inpatient Hospital Stay
Payer: No Typology Code available for payment source | Attending: Hematology and Oncology | Admitting: Hematology and Oncology

## 2023-04-03 VITALS — BP 122/56 | HR 74 | Temp 97.6°F | Resp 16 | Wt 188.7 lb

## 2023-04-03 DIAGNOSIS — Z5986 Financial insecurity: Secondary | ICD-10-CM | POA: Insufficient documentation

## 2023-04-03 DIAGNOSIS — I10 Essential (primary) hypertension: Secondary | ICD-10-CM | POA: Insufficient documentation

## 2023-04-03 DIAGNOSIS — Z1732 Human epidermal growth factor receptor 2 negative status: Secondary | ICD-10-CM | POA: Diagnosis not present

## 2023-04-03 DIAGNOSIS — Z809 Family history of malignant neoplasm, unspecified: Secondary | ICD-10-CM | POA: Insufficient documentation

## 2023-04-03 DIAGNOSIS — Z808 Family history of malignant neoplasm of other organs or systems: Secondary | ICD-10-CM | POA: Insufficient documentation

## 2023-04-03 DIAGNOSIS — Z90710 Acquired absence of both cervix and uterus: Secondary | ICD-10-CM | POA: Diagnosis not present

## 2023-04-03 DIAGNOSIS — Z170 Estrogen receptor positive status [ER+]: Secondary | ICD-10-CM | POA: Diagnosis not present

## 2023-04-03 DIAGNOSIS — C50411 Malignant neoplasm of upper-outer quadrant of right female breast: Secondary | ICD-10-CM | POA: Diagnosis not present

## 2023-04-03 DIAGNOSIS — R232 Flushing: Secondary | ICD-10-CM | POA: Insufficient documentation

## 2023-04-03 DIAGNOSIS — Z1721 Progesterone receptor positive status: Secondary | ICD-10-CM | POA: Insufficient documentation

## 2023-04-03 DIAGNOSIS — E739 Lactose intolerance, unspecified: Secondary | ICD-10-CM | POA: Insufficient documentation

## 2023-04-03 DIAGNOSIS — L723 Sebaceous cyst: Secondary | ICD-10-CM | POA: Diagnosis not present

## 2023-04-03 DIAGNOSIS — Z8042 Family history of malignant neoplasm of prostate: Secondary | ICD-10-CM | POA: Diagnosis not present

## 2023-04-03 DIAGNOSIS — Z800 Family history of malignant neoplasm of digestive organs: Secondary | ICD-10-CM | POA: Insufficient documentation

## 2023-04-03 DIAGNOSIS — Z803 Family history of malignant neoplasm of breast: Secondary | ICD-10-CM | POA: Diagnosis not present

## 2023-04-03 DIAGNOSIS — Z885 Allergy status to narcotic agent status: Secondary | ICD-10-CM | POA: Diagnosis not present

## 2023-04-03 DIAGNOSIS — Z888 Allergy status to other drugs, medicaments and biological substances status: Secondary | ICD-10-CM | POA: Diagnosis not present

## 2023-04-03 DIAGNOSIS — Z79899 Other long term (current) drug therapy: Secondary | ICD-10-CM | POA: Insufficient documentation

## 2023-04-03 DIAGNOSIS — Z79811 Long term (current) use of aromatase inhibitors: Secondary | ICD-10-CM | POA: Insufficient documentation

## 2023-04-03 NOTE — Assessment & Plan Note (Signed)
 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  Breast Cancer On Anastrozole with mild hot flashes. No new changes in breasts or lymph nodes. Sebaceous cyst noted, benign. Mammogram and ultrasound scheduled. -Continue Anastrozole. -Complete scheduled mammogram and ultrasound. Results will be communicated once available.  Bone Health History of strong bone density in 2023. Limited calcium intake due to lactose intolerance. -Continue Vitamin D supplementation. -Add calcium supplementation via Tums a couple times a week. -Continue walking for 30 minutes daily for bone health maintenance.  Follow-up in 6 months, or sooner if any new changes in breast or nipple, or if primary care provider's regular blood work shows any concerning changes.

## 2023-04-03 NOTE — Progress Notes (Signed)
 [facility] CONSULT NOTE  Patient Care Team: Camie Patience, FNP as PCP - General (Family Medicine) Emelia Loron, MD as Consulting Physician (General Surgery) Rachel Moulds, MD as Consulting Physician (Hematology and Oncology) Antony Blackbird, MD as Consulting Physician (Radiation Oncology)  CHIEF COMPLAINTS/PURPOSE OF CONSULTATION:  Newly diagnosed breast cancer  HISTORY OF PRESENTING ILLNESS:  Alexandria Hoover 68 y.o. female is here because of recent diagnosis of right IDC breast cancer  I reviewed her records extensively and collaborated the history with the patient.  SUMMARY OF ONCOLOGIC HISTORY: Oncology History  Malignant neoplasm of upper-outer quadrant of right breast in female, estrogen receptor positive (HCC)  04/12/2021 Mammogram   Mammogram from April 12, 2021 showed possible distortion in the right breast and additional imaging was recommended. Diagnostic mammogram confirmed indeterminate architectural distortion involving the upper outer quadrant of the right breast at middle depth adjacent to a group of benign calcifications.  Stereotactic tomosynthesis core needle biopsy of the right breast was recommended   06/11/2021 Pathology Results   Pathology from the breast biopsy showed invasive mammary carcinoma ductal subtype, grade 1.  Prognostic showed ER 100% positive strong staining PR 100% positive strong staining, KI of 2% and HER2 by FISH pending   06/21/2021 Initial Diagnosis   Malignant neoplasm of upper-outer quadrant of right breast in female, estrogen receptor positive (HCC)   06/23/2021 Cancer Staging   Staging form: Breast, AJCC 8th Edition - Clinical stage from 06/23/2021: Stage IA (cT1c, cN0, cM0, G1, ER+, PR+, HER2-) - Signed by Rachel Moulds, MD on 06/23/2021 Stage prefix: Initial diagnosis Histologic grading system: 3 grade system Laterality: Right Staged by: Pathologist and managing physician Stage used in treatment planning:  Yes National guidelines used in treatment planning: Yes Type of national guideline used in treatment planning: NCCN    Genetic Testing   [facility] CustomNext Panel was Negative. Report date is 07/05/2021.  The CustomNext-Cancer+RNAinsight panel offered by [employer] includes sequencing and rearrangement analysis for the following 47 genes:  APC, ATM, AXIN2, BARD1, BMPR1A, BRCA1, BRCA2, BRIP1, CDH1, CDK4, CDKN2A, CHEK2, CTNNA1, DICER1, EPCAM, GREM1, HOXB13, KIT, MEN1, MLH1, MSH2, MSH3, MSH6, MUTYH, NBN, NF1, NTHL1, PALB2, PDGFRA, PMS2, POLD1, POLE, PTEN, RAD50, RAD51C, RAD51D, SDHA, SDHB, SDHC, SDHD, SMAD4, SMARCA4, STK11, TP53, TSC1, TSC2, and VHL.  RNA data is routinely analyzed for use in variant interpretation for all genes.   07/06/2021 Pathology Results   Pathology from right breast lumpectomy showed 1.5 mm grade 1 IDC, negative margins, all lymph nodes benign, all margins clear, prior prognostic showed ER 100% positive strong staining PR 100% positive strong staining, Ki-67 of 2% and HER2 negative   07/06/2021 Surgery   RIGHT BREAST, LUMPECTOMY: pT1a, pN0   08/16/2021 - 09/08/2021 Radiation Therapy   Site Technique Total Dose (Gy) Dose per Fx (Gy) Completed Fx Beam Energies  Breast, Right: Breast_R 3D 42.72/42.72 2.67 16/16 10X     09/2021 -  Anti-estrogen oral therapy   Anastrozole    Interval history  Alexandria Hoover is a 68 year old female with breast cancer who presents for follow-up regarding her treatment with anastrozole.  She has been on anastrozole since August for breast cancer treatment and experiences mild hot flashes as a side effect. No other issues with the medication have been reported. She is currently awaiting a mammogram and ultrasound to monitor her condition.  She has noticed a sebaceous cyst near the nipple, which has not changed or shown any signs of oozing. She has a  history of similar cysts on other parts of her body.  She maintains her bone health by walking  regularly for about 30 minutes daily and taking vitamin D supplements. Due to her avoidance of dairy products, she is considering calcium supplements to support bone health.  She has not had any new medical issues or hospital visits since her last appointment. Her primary care visits have been routine. Rest of the pertinent 10 point ROS reviewed and negative  MEDICAL HISTORY:  Past Medical History:  Diagnosis Date   Breast cancer (HCC) 2023   Diabetes (HCC)    type 2   Heart murmur    History of radiation therapy    Right breast 08/16/21-09/08/21-Dr. Antony Blackbird   Hypercholesteremia    Hypertension    Personal history of radiation therapy     SURGICAL HISTORY: Past Surgical History:  Procedure Laterality Date   ABDOMINAL HYSTERECTOMY     BREAST BIOPSY Right 06/11/2021   BREAST EXCISIONAL BIOPSY Right over 10 yrs   BREAST LUMPECTOMY     BREAST LUMPECTOMY WITH RADIOACTIVE SEED AND SENTINEL LYMPH NODE BIOPSY Right 07/06/2021   Procedure: RIGHT BREAST LUMPECTOMY WITH RADIOACTIVE SEED AND AXILLARY SENTINEL LYMPH NODE BIOPSY;  Surgeon: Emelia Loron, MD;  Location: [facility];  Service: General;  Laterality: Right;   TUBAL LIGATION     WISDOM TOOTH EXTRACTION      SOCIAL HISTORY: Social History   Socioeconomic History   Marital status: Divorced    Spouse name: Not on file   Number of children: 1   Years of education: Not on file   Highest education level: Not on file  Occupational History   Not on file  Tobacco Use   Smoking status: Never   Smokeless tobacco: Never  Vaping Use   Vaping status: Never Used  Substance and Sexual Activity   Alcohol use: Never   Drug use: Never   Sexual activity: Not on file  Other Topics Concern   Not on file  Social History Narrative   Not on file   Social Drivers of Health   Financial Resource Strain: Medium Risk (06/23/2021)   Overall Financial Resource Strain (CARDIA)    Difficulty of Paying Living Expenses:  Somewhat hard  Food Insecurity: No Food Insecurity (06/23/2021)   Hunger Vital Sign    Worried About Running Out of Food in the Last Year: Never true    Ran Out of Food in the Last Year: Never true  Transportation Needs: No Transportation Needs (06/23/2021)   PRAPARE - Administrator, Civil Service (Medical): No    Lack of Transportation (Non-Medical): No  Physical Activity: Not on file  Stress: Not on file  Social Connections: Unknown (07/11/2021)   Received from [facility], [facility]   Social Network    Social Network: Not on file  Intimate Partner Violence: Unknown (07/11/2021)   Received from [facility], [facility]   HITS    Physically Hurt: Not on file    Insult or Talk Down To: Not on file    Threaten Physical Harm: Not on file    Scream or Curse: Not on file    FAMILY HISTORY: Family History  Problem Relation Age of Onset   Throat cancer Father    Cancer Sister    Breast cancer Sister 86   Pancreatic cancer Brother        maternal half-brother   Throat cancer Brother        maternal  half-brother   Breast cancer Paternal Aunt    Prostate cancer Paternal Uncle    Prostate cancer Cousin     ALLERGIES:  is allergic to codeine and lisinopril.  MEDICATIONS:  Current Outpatient Medications  Medication Sig Dispense Refill   amLODipine (NORVASC) 10 MG tablet Take 10 mg by mouth daily.     anastrozole (ARIMIDEX) 1 MG tablet Take 1 tablet (1 mg total) by mouth daily. 90 tablet 3   Cholecalciferol (VITAMIN D3) 250 MCG (10000 UT) capsule 10,000 Units daily.     loratadine (CLARITIN) 10 MG tablet Take 10 mg by mouth daily as needed for allergies.     losartan-hydrochlorothiazide (HYZAAR) 100-25 MG tablet Take 1 tablet by mouth daily.     metFORMIN (GLUCOPHAGE-XR) 500 MG 24 hr tablet 500 mg daily with breakfast.     pravastatin (PRAVACHOL) 40 MG tablet 1 tablet     No current facility-administered medications for this visit.     PHYSICAL  EXAMINATION: ECOG PERFORMANCE STATUS: 0 - Asymptomatic  Vitals:   04/03/23 0937  BP: (!) 122/56  Pulse: 74  Resp: 16  Temp: 97.6 F (36.4 C)  SpO2: 96%   Filed Weights   04/03/23 0937  Weight: 188 lb 11.2 oz (85.6 kg)    Physical Exam Constitutional:      Appearance: Normal appearance.  Chest:       Comments: Bilateral breasts inspected and palpated. Post surgical changes noted in the right breast. Palpable sebaceous cyst ( thought to be ) noted in the right breast adjacent to the nipple. No other masses. No regional adenopathy.  Neurological:     Mental Status: She is alert.      LABORATORY DATA:  I have reviewed the data as listed Lab Results  Component Value Date   WBC 7.0 06/23/2021   HGB 12.3 06/23/2021   HCT 39.5 06/23/2021   MCV 77.3 (L) 06/23/2021   PLT 361 06/23/2021   Lab Results  Component Value Date   NA 140 07/02/2021   K 3.3 (L) 07/02/2021   CL 103 07/02/2021   CO2 27 07/02/2021    RADIOGRAPHIC STUDIES: I have personally reviewed the radiological reports and agreed with the findings in the report.  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  Breast Cancer On Anastrozole with mild hot flashes. No new changes in breasts or lymph nodes. Sebaceous cyst noted, benign. Mammogram and ultrasound scheduled. -Continue Anastrozole. -Complete scheduled mammogram and ultrasound. Results will be communicated once available.  Bone Health History of strong bone density in 2023. Limited calcium intake due to lactose intolerance. -Continue Vitamin D supplementation. -Add calcium supplementation via Tums a couple times a week. -Continue walking  for 30 minutes daily for bone health maintenance.  Follow-up in 6 months, or sooner if any new changes in breast or nipple, or if primary care provider's regular blood work shows any concerning changes.  Total time spent: 30 minutes  All questions were answered. The patient knows to call the clinic with any problems, questions or concerns.    Rachel Moulds, MD 04/03/23

## 2023-04-19 DIAGNOSIS — I10 Essential (primary) hypertension: Secondary | ICD-10-CM | POA: Diagnosis not present

## 2023-04-19 DIAGNOSIS — L602 Onychogryphosis: Secondary | ICD-10-CM | POA: Diagnosis not present

## 2023-04-19 DIAGNOSIS — Z6835 Body mass index (BMI) 35.0-35.9, adult: Secondary | ICD-10-CM | POA: Diagnosis not present

## 2023-04-19 DIAGNOSIS — Z0000 Encounter for general adult medical examination without abnormal findings: Secondary | ICD-10-CM | POA: Diagnosis not present

## 2023-04-19 DIAGNOSIS — C50411 Malignant neoplasm of upper-outer quadrant of right female breast: Secondary | ICD-10-CM | POA: Diagnosis not present

## 2023-04-19 DIAGNOSIS — E1165 Type 2 diabetes mellitus with hyperglycemia: Secondary | ICD-10-CM | POA: Diagnosis not present

## 2023-04-19 DIAGNOSIS — E66812 Obesity, class 2: Secondary | ICD-10-CM | POA: Diagnosis not present

## 2023-04-19 DIAGNOSIS — E7800 Pure hypercholesterolemia, unspecified: Secondary | ICD-10-CM | POA: Diagnosis not present

## 2023-04-20 ENCOUNTER — Other Ambulatory Visit: Payer: 59

## 2023-04-21 ENCOUNTER — Ambulatory Visit
Admission: RE | Admit: 2023-04-21 | Discharge: 2023-04-21 | Disposition: A | Discharge status: Home / Self Care | Payer: 59 | Source: Ambulatory Visit | Attending: Adult Health | Admitting: Adult Health

## 2023-04-21 DIAGNOSIS — N6315 Unspecified lump in the right breast, overlapping quadrants: Secondary | ICD-10-CM | POA: Diagnosis not present

## 2023-04-21 DIAGNOSIS — N6310 Unspecified lump in the right breast, unspecified quadrant: Secondary | ICD-10-CM

## 2023-04-27 ENCOUNTER — Ambulatory Visit: Admitting: Podiatry

## 2023-04-28 ENCOUNTER — Encounter: Payer: Self-pay | Admitting: Podiatry

## 2023-04-28 ENCOUNTER — Ambulatory Visit (INDEPENDENT_AMBULATORY_CARE_PROVIDER_SITE_OTHER): Admitting: Podiatry

## 2023-04-28 DIAGNOSIS — M79675 Pain in left toe(s): Secondary | ICD-10-CM | POA: Diagnosis not present

## 2023-04-28 DIAGNOSIS — M79674 Pain in right toe(s): Secondary | ICD-10-CM

## 2023-04-28 DIAGNOSIS — B351 Tinea unguium: Secondary | ICD-10-CM | POA: Insufficient documentation

## 2023-04-28 DIAGNOSIS — E119 Type 2 diabetes mellitus without complications: Secondary | ICD-10-CM

## 2023-04-28 NOTE — Progress Notes (Signed)
 This patient presents to the office for diabetic foot exam.  Patient was referred to the office by   doctor.  This patient says there is no pain or discomfort in her feet.  No history of infection or drainage.  This patient presents to the office for foot exam due to having a history of diabetes.  Vascular  Dorsalis pedis and posterior tibial pulses are palpable  B/L.  Capillary return  WNL.  Temperature gradient is  WNL.  Skin turgor  WNL  Sensorium  Senn Weinstein monofilament wire  WNL. Normal tactile sensation.  Nail Exam  Patient has normal nails with no evidence of bacterial or fungal infection.  Orthopedic  Exam  Muscle tone and muscle strength  WNL.  No limitations of motion feet  B/L.  No crepitus or joint effusion noted.  Foot type is unremarkable and digits show no abnormalities.  Bony prominences are unremarkable.Long thick hallux nails.  Skin  No open lesions.  Normal skin texture and turgor.   Diabetes with no complications  Onychomycosis Hallux  B/l.  Diabetic foot exam was performed.  There is no evidence of vascular or neurologic pathology.   Debride nails with nail nipper  and dremel tool. RTC  6 months    Helane Gunther DPM

## 2023-05-08 ENCOUNTER — Ambulatory Visit: Payer: No Typology Code available for payment source | Attending: General Surgery

## 2023-05-08 VITALS — Wt 191.1 lb

## 2023-05-08 DIAGNOSIS — Z483 Aftercare following surgery for neoplasm: Secondary | ICD-10-CM | POA: Insufficient documentation

## 2023-05-08 NOTE — Therapy (Signed)
 OUTPATIENT PHYSICAL THERAPY SOZO SCREENING NOTE   Patient Name: Alexandria Hoover MRN: [id number] DOB:1955-06-26, 68 y.o., female Today's Date: 05/08/2023  PCP: Camie Patience, FNP REFERRING PROVIDER: Emelia Loron, MD   PT End of Session - 05/08/23 [phone]     Visit Number 5   # unchanged due to screen only   PT Start Time 0832    PT Stop Time 0836    PT Time Calculation (min) 4 min    Activity Tolerance Patient tolerated treatment well    Behavior During Therapy [facility] for tasks assessed/performed             Past Medical History:  Diagnosis Date   Breast cancer (HCC) 2023   Diabetes (HCC)    type 2   Heart murmur    History of radiation therapy    Right breast 08/16/21-09/08/21-Dr. Antony Blackbird   Hypercholesteremia    Hypertension    Personal history of radiation therapy    Past Surgical History:  Procedure Laterality Date   ABDOMINAL HYSTERECTOMY     BREAST BIOPSY Right 06/11/2021   BREAST EXCISIONAL BIOPSY Right over 10 yrs   BREAST LUMPECTOMY     BREAST LUMPECTOMY WITH RADIOACTIVE SEED AND SENTINEL LYMPH NODE BIOPSY Right 07/06/2021   Procedure: RIGHT BREAST LUMPECTOMY WITH RADIOACTIVE SEED AND AXILLARY SENTINEL LYMPH NODE BIOPSY;  Surgeon: Emelia Loron, MD;  Location: [facility];  Service: General;  Laterality: Right;   TUBAL LIGATION     WISDOM TOOTH EXTRACTION     Patient Active Problem List   Diagnosis Date Noted   Pain due to onychomycosis of toenails of both feet 04/28/2023   Diabetes mellitus without complication (HCC) 04/28/2023   Genetic testing 06/30/2021   Family history of breast cancer 06/24/2021   Family history of prostate cancer 06/24/2021   Malignant neoplasm of upper-outer quadrant of right breast in female, estrogen receptor positive (HCC) 06/21/2021    REFERRING DIAG: right breast cancer at risk for lymphedema  THERAPY DIAG: Aftercare following surgery for neoplasm  PERTINENT HISTORY: Patient was diagnosed on  04/12/2021 with right grade 1 invasive ductal carcinoma breast cancer. It measures 1.7 cm and is located in the upper outer quadrant. It is ER/PR positive and HER2 negative with a Ki67 of 2%. Also has hypertension and diabetes. Pt had lumpectomy with 0/3 sentinel nodes removed on 07/06/2021 . Pt will be starting radiation on 08/02/2021 and completed on 09/08/21.   PRECAUTIONS: right UE Lymphedema risk, None  SUBJECTIVE: Pt returns for her last 3 month L-Dex screen .  PAIN:  Are you having pain? No  SOZO SCREENING: Patient was assessed today using the SOZO machine to determine the lymphedema index score. This was compared to her baseline score. It was determined that she is within the recommended range when compared to her baseline and no further action is needed at this time. She will continue SOZO screenings. These are done every 3 months for 2 years post operatively followed by every 6 months for 2 years, and then annually.   L-DEX FLOWSHEETS - 05/08/23 0800       L-DEX LYMPHEDEMA SCREENING   Measurement Type Unilateral    L-DEX MEASUREMENT EXTREMITY Upper Extremity    POSITION  Standing    DOMINANT SIDE Right    At Risk Side Right    BASELINE SCORE (UNILATERAL) 0    L-DEX SCORE (UNILATERAL) -3.9    VALUE CHANGE (UNILAT) -3.9  P: Begin 6 month screens next.   Hermenia Bers, PTA 05/08/2023, 8:35 AM

## 2023-06-20 DIAGNOSIS — E119 Type 2 diabetes mellitus without complications: Secondary | ICD-10-CM | POA: Diagnosis not present

## 2023-08-01 DIAGNOSIS — C50911 Malignant neoplasm of unspecified site of right female breast: Secondary | ICD-10-CM | POA: Diagnosis not present

## 2023-08-10 DIAGNOSIS — C50911 Malignant neoplasm of unspecified site of right female breast: Secondary | ICD-10-CM | POA: Diagnosis not present

## 2023-09-04 ENCOUNTER — Inpatient Hospital Stay: Attending: Hematology and Oncology | Admitting: Hematology and Oncology

## 2023-09-04 ENCOUNTER — Encounter: Payer: Self-pay | Admitting: Hematology and Oncology

## 2023-09-04 VITALS — BP 128/66 | HR 75 | Temp 97.7°F | Resp 18 | Ht 64.0 in | Wt 188.5 lb

## 2023-09-04 DIAGNOSIS — Z170 Estrogen receptor positive status [ER+]: Secondary | ICD-10-CM | POA: Insufficient documentation

## 2023-09-04 DIAGNOSIS — R194 Change in bowel habit: Secondary | ICD-10-CM | POA: Insufficient documentation

## 2023-09-04 DIAGNOSIS — Z888 Allergy status to other drugs, medicaments and biological substances status: Secondary | ICD-10-CM | POA: Diagnosis not present

## 2023-09-04 DIAGNOSIS — Z1732 Human epidermal growth factor receptor 2 negative status: Secondary | ICD-10-CM | POA: Diagnosis not present

## 2023-09-04 DIAGNOSIS — C50411 Malignant neoplasm of upper-outer quadrant of right female breast: Secondary | ICD-10-CM | POA: Insufficient documentation

## 2023-09-04 DIAGNOSIS — M79605 Pain in left leg: Secondary | ICD-10-CM | POA: Insufficient documentation

## 2023-09-04 DIAGNOSIS — Z8042 Family history of malignant neoplasm of prostate: Secondary | ICD-10-CM | POA: Diagnosis not present

## 2023-09-04 DIAGNOSIS — Z79811 Long term (current) use of aromatase inhibitors: Secondary | ICD-10-CM | POA: Insufficient documentation

## 2023-09-04 DIAGNOSIS — Z1721 Progesterone receptor positive status: Secondary | ICD-10-CM | POA: Diagnosis not present

## 2023-09-04 DIAGNOSIS — Z90710 Acquired absence of both cervix and uterus: Secondary | ICD-10-CM | POA: Insufficient documentation

## 2023-09-04 DIAGNOSIS — M25579 Pain in unspecified ankle and joints of unspecified foot: Secondary | ICD-10-CM | POA: Diagnosis not present

## 2023-09-04 DIAGNOSIS — Z808 Family history of malignant neoplasm of other organs or systems: Secondary | ICD-10-CM | POA: Diagnosis not present

## 2023-09-04 DIAGNOSIS — M25569 Pain in unspecified knee: Secondary | ICD-10-CM | POA: Diagnosis not present

## 2023-09-04 DIAGNOSIS — Z809 Family history of malignant neoplasm, unspecified: Secondary | ICD-10-CM | POA: Diagnosis not present

## 2023-09-04 DIAGNOSIS — Z885 Allergy status to narcotic agent status: Secondary | ICD-10-CM | POA: Insufficient documentation

## 2023-09-04 DIAGNOSIS — M7989 Other specified soft tissue disorders: Secondary | ICD-10-CM | POA: Insufficient documentation

## 2023-09-04 DIAGNOSIS — E119 Type 2 diabetes mellitus without complications: Secondary | ICD-10-CM | POA: Insufficient documentation

## 2023-09-04 DIAGNOSIS — Z803 Family history of malignant neoplasm of breast: Secondary | ICD-10-CM | POA: Diagnosis not present

## 2023-09-04 DIAGNOSIS — Z5986 Financial insecurity: Secondary | ICD-10-CM | POA: Diagnosis not present

## 2023-09-04 DIAGNOSIS — Z79899 Other long term (current) drug therapy: Secondary | ICD-10-CM | POA: Insufficient documentation

## 2023-09-04 MED ORDER — ANASTROZOLE 1 MG PO TABS: 1.0000 mg | ORAL_TABLET | Freq: Every day | ORAL | 3 refills | Status: DC

## 2023-09-04 NOTE — Assessment & Plan Note (Addendum)
 Assessment and Plan Assessment & Plan Breast cancer On anastrozole  since August 2023 without new side effects. March imaging showed dense breasts with superficial dermal thickening and a hypoechoic area, likely a cyst or post-surgical change. No suspicious masses. - Order bilateral mammogram and ultrasound for March 2026. - Refill anastrozole  prescription for one year.  Left leg pain and swelling Left leg pain and swelling for one month, differential includes DVT though not strongly suspected. Slight calf tenderness on flexion, no significant swelling difference between legs. - Order ultrasound of the left leg to rule out DVT. - Advise emergency room visit if increased swelling or sudden onset shortness of breath. - Schedule ultrasound for this week.

## 2023-09-04 NOTE — Progress Notes (Signed)
 [facility] CONSULT NOTE  Patient Care Team: Dyane Anthony RAMAN, FNP as PCP - General (Family Medicine) Ebbie Cough, MD as Consulting Physician (General Surgery) Loretha Ash, MD as Consulting Physician (Hematology and Oncology) Shannon Agent, MD as Consulting Physician (Radiation Oncology)  CHIEF COMPLAINTS/PURPOSE OF CONSULTATION:  Newly diagnosed breast cancer  HISTORY OF PRESENTING ILLNESS:  Alexandria Hoover 68 y.o. female is here because of recent diagnosis of right IDC breast cancer  I reviewed her records extensively and collaborated the history with the patient.  SUMMARY OF ONCOLOGIC HISTORY: Oncology History  Malignant neoplasm of upper-outer quadrant of right breast in female, estrogen receptor positive (HCC)  04/12/2021 Mammogram   Mammogram from April 12, 2021 showed possible distortion in the right breast and additional imaging was recommended. Diagnostic mammogram confirmed indeterminate architectural distortion involving the upper outer quadrant of the right breast at middle depth adjacent to a group of benign calcifications.  Stereotactic tomosynthesis core needle biopsy of the right breast was recommended   06/11/2021 Pathology Results   Pathology from the breast biopsy showed invasive mammary carcinoma ductal subtype, grade 1.  Prognostic showed ER 100% positive strong staining PR 100% positive strong staining, KI of 2% and HER2 by FISH pending   06/21/2021 Initial Diagnosis   Malignant neoplasm of upper-outer quadrant of right breast in female, estrogen receptor positive (HCC)   06/23/2021 Cancer Staging   Staging form: Breast, AJCC 8th Edition - Clinical stage from 06/23/2021: Stage IA (cT1c, cN0, cM0, G1, ER+, PR+, HER2-) - Signed by Loretha Ash, MD on 06/23/2021 Stage prefix: Initial diagnosis Histologic grading system: 3 grade system Laterality: Right Staged by: Pathologist and managing physician Stage used in treatment planning:  Yes National guidelines used in treatment planning: Yes Type of national guideline used in treatment planning: NCCN    Genetic Testing   [facility] CustomNext Panel was Negative. Report date is 07/05/2021.  The CustomNext-Cancer+RNAinsight panel offered by [employer] includes sequencing and rearrangement analysis for the following 47 genes:  APC, ATM, AXIN2, BARD1, BMPR1A, BRCA1, BRCA2, BRIP1, CDH1, CDK4, CDKN2A, CHEK2, CTNNA1, DICER1, EPCAM, GREM1, HOXB13, KIT, MEN1, MLH1, MSH2, MSH3, MSH6, MUTYH, NBN, NF1, NTHL1, PALB2, PDGFRA, PMS2, POLD1, POLE, PTEN, RAD50, RAD51C, RAD51D, SDHA, SDHB, SDHC, SDHD, SMAD4, SMARCA4, STK11, TP53, TSC1, TSC2, and VHL.  RNA data is routinely analyzed for use in variant interpretation for all genes.   07/06/2021 Pathology Results   Pathology from right breast lumpectomy showed 1.5 mm grade 1 IDC, negative margins, all lymph nodes benign, all margins clear, prior prognostic showed ER 100% positive strong staining PR 100% positive strong staining, Ki-67 of 2% and HER2 negative   07/06/2021 Surgery   RIGHT BREAST, LUMPECTOMY: pT1a, pN0   08/16/2021 - 09/08/2021 Radiation Therapy   Site Technique Total Dose (Gy) Dose per Fx (Gy) Completed Fx Beam Energies  Breast, Right: Breast_R 3D 42.72/42.72 2.67 16/16 10X     09/2021 -  Anti-estrogen oral therapy   Anastrozole     Discussed the use of AI scribe software for clinical note transcription with the patient, who gave verbal consent to proceed.  History of Present Illness Alexandria Hoover is a 68 year old female with breast cancer on anastrozole  who presents with left leg pain and swelling.  She has been experiencing pain in her left leg, extending from the knee to the ankle, for approximately one month. The pain is not activity-dependent and occurs at any time. She describes the ankle as sometimes feeling 'slow.' Despite the discomfort,  she remains active and attempts to stay mobile.  No recent travel, prolonged  sitting, or history of blood clots. No known family history of blood clots. No changes in breathing, bowel habits, urination, or blood in stool.  She is currently taking anastrozole  for breast cancer, which she started in August 2023, and reports no new side effects from this medication. In March, she underwent a mammogram and ultrasound, A hypoechoic area was noted which was thought to be benign  Rest of the pertinent 10 point ROS reviewed and negative  MEDICAL HISTORY:  Past Medical History:  Diagnosis Date   Breast cancer (HCC) 2023   Diabetes (HCC)    type 2   Heart murmur    History of radiation therapy    Right breast 08/16/21-09/08/21-Dr. Lynwood Nasuti   Hypercholesteremia    Hypertension    Personal history of radiation therapy     SURGICAL HISTORY: Past Surgical History:  Procedure Laterality Date   ABDOMINAL HYSTERECTOMY     BREAST BIOPSY Right 06/11/2021   BREAST EXCISIONAL BIOPSY Right over 10 yrs   BREAST LUMPECTOMY     BREAST LUMPECTOMY WITH RADIOACTIVE SEED AND SENTINEL LYMPH NODE BIOPSY Right 07/06/2021   Procedure: RIGHT BREAST LUMPECTOMY WITH RADIOACTIVE SEED AND AXILLARY SENTINEL LYMPH NODE BIOPSY;  Surgeon: Ebbie Cough, MD;  Location: [facility];  Service: General;  Laterality: Right;   TUBAL LIGATION     WISDOM TOOTH EXTRACTION      SOCIAL HISTORY: Social History   Socioeconomic History   Marital status: Divorced    Spouse name: Not on file   Number of children: 1   Years of education: Not on file   Highest education level: Not on file  Occupational History   Not on file  Tobacco Use   Smoking status: Never   Smokeless tobacco: Never  Vaping Use   Vaping status: Never Used  Substance and Sexual Activity   Alcohol use: Never   Drug use: Never   Sexual activity: Not on file  Other Topics Concern   Not on file  Social History Narrative   Not on file   Social Drivers of Health   Financial Resource Strain: Medium Risk  (06/23/2021)   Overall Financial Resource Strain (CARDIA)    Difficulty of Paying Living Expenses: Somewhat hard  Food Insecurity: No Food Insecurity (06/23/2021)   Hunger Vital Sign    Worried About Running Out of Food in the Last Year: Never true    Ran Out of Food in the Last Year: Never true  Transportation Needs: No Transportation Needs (06/23/2021)   PRAPARE - Administrator, Civil Service (Medical): No    Lack of Transportation (Non-Medical): No  Physical Activity: Not on file  Stress: Not on file  Social Connections: Unknown (07/11/2021)   Received from [facility]   Social Network    Social Network: Not on file  Intimate Partner Violence: Unknown (07/11/2021)   Received from [facility]   HITS    Physically Hurt: Not on file    Insult or Talk Down To: Not on file    Threaten Physical Harm: Not on file    Scream or Curse: Not on file    FAMILY HISTORY: Family History  Problem Relation Age of Onset   Throat cancer Father    Cancer Sister    Breast cancer Sister 59   Pancreatic cancer Brother        maternal half-brother   Throat cancer  Brother        maternal half-brother   Breast cancer Paternal Aunt    Prostate cancer Paternal Uncle    Prostate cancer Cousin     ALLERGIES:  is allergic to codeine and lisinopril.  MEDICATIONS:  Current Outpatient Medications  Medication Sig Dispense Refill   amLODipine (NORVASC) 10 MG tablet Take 10 mg by mouth daily.     anastrozole  (ARIMIDEX ) 1 MG tablet Take 1 tablet (1 mg total) by mouth daily. 90 tablet 3   Cholecalciferol (VITAMIN D3) 250 MCG (10000 UT) capsule 10,000 Units daily.     loratadine (CLARITIN) 10 MG tablet Take 10 mg by mouth daily as needed for allergies.     losartan-hydrochlorothiazide (HYZAAR) 100-25 MG tablet Take 1 tablet by mouth daily.     metFORMIN (GLUCOPHAGE-XR) 500 MG 24 hr tablet 500 mg daily with breakfast.     pravastatin (PRAVACHOL) 40 MG tablet 1 tablet     No current  facility-administered medications for this visit.     PHYSICAL EXAMINATION: ECOG PERFORMANCE STATUS: 0 - Asymptomatic  Vitals:   09/04/23 0829  BP: 128/66  Pulse: 75  Resp: 18  Temp: 97.7 F (36.5 C)  SpO2: 95%    Filed Weights   09/04/23 0829  Weight: 188 lb 8 oz (85.5 kg)     Physical Exam Constitutional:      Appearance: Normal appearance.  Chest:       Comments: Bilateral breasts inspected and palpated. Post surgical changes noted in the right breast. No palpable masses or regional adenopathy. Neurological:     Mental Status: She is alert.      LABORATORY DATA:  I have reviewed the data as listed Lab Results  Component Value Date   WBC 7.0 06/23/2021   HGB 12.3 06/23/2021   HCT 39.5 06/23/2021   MCV 77.3 (L) 06/23/2021   PLT 361 06/23/2021   Lab Results  Component Value Date   NA 140 07/02/2021   K 3.3 (L) 07/02/2021   CL 103 07/02/2021   CO2 27 07/02/2021    RADIOGRAPHIC STUDIES: I have personally reviewed the radiological reports and agreed with the findings in the report.  ASSESSMENT AND PLAN:  Malignant neoplasm of upper-outer quadrant of right breast in female, estrogen receptor positive (HCC) Assessment and Plan Assessment & Plan Breast cancer On anastrozole  since August 2023 without new side effects. March imaging showed dense breasts with superficial dermal thickening and a hypoechoic area, likely a cyst or post-surgical change. No suspicious masses. - Order bilateral mammogram and ultrasound for March 2026. - Refill anastrozole  prescription for one year.  Left leg pain and swelling Left leg pain and swelling for one month, differential includes DVT though not strongly suspected. Slight calf tenderness on flexion, no significant swelling difference between legs. - Order ultrasound of the left leg to rule out DVT. - Advise emergency room visit if increased swelling or sudden onset shortness of breath. - Schedule ultrasound for this  week.   Total time spent: 30 minutes  All questions were answered. The patient knows to call the clinic with any problems, questions or concerns.    Amber Stalls, MD 09/04/23

## 2023-09-06 ENCOUNTER — Ambulatory Visit (HOSPITAL_COMMUNITY)
Admission: RE | Admit: 2023-09-06 | Discharge: 2023-09-06 | Disposition: A | Discharge status: Home / Self Care | Source: Ambulatory Visit | Attending: Vascular Surgery | Admitting: Vascular Surgery

## 2023-09-06 DIAGNOSIS — C50411 Malignant neoplasm of upper-outer quadrant of right female breast: Secondary | ICD-10-CM | POA: Insufficient documentation

## 2023-09-06 DIAGNOSIS — M7989 Other specified soft tissue disorders: Secondary | ICD-10-CM | POA: Diagnosis not present

## 2023-09-06 DIAGNOSIS — Z170 Estrogen receptor positive status [ER+]: Secondary | ICD-10-CM | POA: Insufficient documentation

## 2023-09-08 ENCOUNTER — Other Ambulatory Visit: Payer: Self-pay | Admitting: *Deleted

## 2023-09-11 ENCOUNTER — Other Ambulatory Visit: Payer: Self-pay | Admitting: *Deleted

## 2023-09-11 ENCOUNTER — Telehealth: Payer: Self-pay | Admitting: *Deleted

## 2023-09-11 MED ORDER — ANASTROZOLE 1 MG PO TABS: 1.0000 mg | ORAL_TABLET | Freq: Every day | ORAL | 3 refills | Status: AC

## 2023-09-11 NOTE — Telephone Encounter (Signed)
 Refill sent to [facility] on [address]

## 2023-09-14 DIAGNOSIS — I10 Essential (primary) hypertension: Secondary | ICD-10-CM | POA: Diagnosis not present

## 2023-09-14 DIAGNOSIS — E669 Obesity, unspecified: Secondary | ICD-10-CM | POA: Diagnosis not present

## 2023-09-14 DIAGNOSIS — E66812 Obesity, class 2: Secondary | ICD-10-CM | POA: Diagnosis not present

## 2023-09-14 DIAGNOSIS — C50411 Malignant neoplasm of upper-outer quadrant of right female breast: Secondary | ICD-10-CM | POA: Diagnosis not present

## 2023-09-20 ENCOUNTER — Inpatient Hospital Stay: Attending: Hematology and Oncology | Admitting: Hematology and Oncology

## 2023-09-20 DIAGNOSIS — C50411 Malignant neoplasm of upper-outer quadrant of right female breast: Secondary | ICD-10-CM

## 2023-09-20 DIAGNOSIS — Z170 Estrogen receptor positive status [ER+]: Secondary | ICD-10-CM

## 2023-09-20 NOTE — Progress Notes (Unsigned)
 [facility] CONSULT NOTE  Patient Care Team: Dyane Anthony RAMAN, FNP as PCP - General (Family Medicine) Ebbie Cough, MD as Consulting Physician (General Surgery) Loretha Ash, MD as Consulting Physician (Hematology and Oncology) Shannon Agent, MD as Consulting Physician (Radiation Oncology)  CHIEF COMPLAINTS/PURPOSE OF CONSULTATION:  Newly diagnosed breast cancer  HISTORY OF PRESENTING ILLNESS:  Alexandria Hoover 68 y.o. female is here because of recent diagnosis of right IDC breast cancer  I reviewed her records extensively and collaborated the history with the patient.  SUMMARY OF ONCOLOGIC HISTORY: Oncology History  Malignant neoplasm of upper-outer quadrant of right breast in female, estrogen receptor positive (HCC)  04/12/2021 Mammogram   Mammogram from April 12, 2021 showed possible distortion in the right breast and additional imaging was recommended. Diagnostic mammogram confirmed indeterminate architectural distortion involving the upper outer quadrant of the right breast at middle depth adjacent to a group of benign calcifications.  Stereotactic tomosynthesis core needle biopsy of the right breast was recommended   06/11/2021 Pathology Results   Pathology from the breast biopsy showed invasive mammary carcinoma ductal subtype, grade 1.  Prognostic showed ER 100% positive strong staining PR 100% positive strong staining, KI of 2% and HER2 by FISH pending   06/21/2021 Initial Diagnosis   Malignant neoplasm of upper-outer quadrant of right breast in female, estrogen receptor positive (HCC)   06/23/2021 Cancer Staging   Staging form: Breast, AJCC 8th Edition - Clinical stage from 06/23/2021: Stage IA (cT1c, cN0, cM0, G1, ER+, PR+, HER2-) - Signed by Loretha Ash, MD on 06/23/2021 Stage prefix: Initial diagnosis Histologic grading system: 3 grade system Laterality: Right Staged by: Pathologist and managing physician Stage used in treatment planning:  Yes National guidelines used in treatment planning: Yes Type of national guideline used in treatment planning: NCCN    Genetic Testing   [employer] CustomNext Panel was Negative. Report date is 07/05/2021.  The CustomNext-Cancer+RNAinsight panel offered by [employer] includes sequencing and rearrangement analysis for the following 47 genes:  APC, ATM, AXIN2, BARD1, BMPR1A, BRCA1, BRCA2, BRIP1, CDH1, CDK4, CDKN2A, CHEK2, CTNNA1, DICER1, EPCAM, GREM1, HOXB13, KIT, MEN1, MLH1, MSH2, MSH3, MSH6, MUTYH, NBN, NF1, NTHL1, PALB2, PDGFRA, PMS2, POLD1, POLE, PTEN, RAD50, RAD51C, RAD51D, SDHA, SDHB, SDHC, SDHD, SMAD4, SMARCA4, STK11, TP53, TSC1, TSC2, and VHL.  RNA data is routinely analyzed for use in variant interpretation for all genes.   07/06/2021 Pathology Results   Pathology from right breast lumpectomy showed 1.5 mm grade 1 IDC, negative margins, all lymph nodes benign, all margins clear, prior prognostic showed ER 100% positive strong staining PR 100% positive strong staining, Ki-67 of 2% and HER2 negative   07/06/2021 Surgery   RIGHT BREAST, LUMPECTOMY: pT1a, pN0   08/16/2021 - 09/08/2021 Radiation Therapy   Site Technique Total Dose (Gy) Dose per Fx (Gy) Completed Fx Beam Energies  Breast, Right: Breast_R 3D 42.72/42.72 2.67 16/16 10X     09/2021 -  Anti-estrogen oral therapy   Anastrozole     Discussed the use of AI scribe software for clinical note transcription with the patient, who gave verbal consent to proceed.  History of Present Illness This is a follow up telephone visit.  Rest of the pertinent 10 point ROS reviewed and negative  MEDICAL HISTORY:  Past Medical History:  Diagnosis Date   Breast cancer (HCC) 2023   Diabetes (HCC)    type 2   Heart murmur    History of radiation therapy    Right breast 08/16/21-09/08/21-Dr. Agent Shannon  Hypercholesteremia    Hypertension    Personal history of radiation therapy     SURGICAL HISTORY: Past Surgical History:  Procedure  Laterality Date   ABDOMINAL HYSTERECTOMY     BREAST BIOPSY Right 06/11/2021   BREAST EXCISIONAL BIOPSY Right over 10 yrs   BREAST LUMPECTOMY     BREAST LUMPECTOMY WITH RADIOACTIVE SEED AND SENTINEL LYMPH NODE BIOPSY Right 07/06/2021   Procedure: RIGHT BREAST LUMPECTOMY WITH RADIOACTIVE SEED AND AXILLARY SENTINEL LYMPH NODE BIOPSY;  Surgeon: Ebbie Cough, MD;  Location: [facility];  Service: General;  Laterality: Right;   TUBAL LIGATION     WISDOM TOOTH EXTRACTION      SOCIAL HISTORY: Social History   Socioeconomic History   Marital status: Divorced    Spouse name: Not on file   Number of children: 1   Years of education: Not on file   Highest education level: Not on file  Occupational History   Not on file  Tobacco Use   Smoking status: Never   Smokeless tobacco: Never  Vaping Use   Vaping status: Never Used  Substance and Sexual Activity   Alcohol use: Never   Drug use: Never   Sexual activity: Not on file  Other Topics Concern   Not on file  Social History Narrative   Not on file   Social Drivers of Health   Financial Resource Strain: Medium Risk (06/23/2021)   Overall Financial Resource Strain (CARDIA)    Difficulty of Paying Living Expenses: Somewhat hard  Food Insecurity: No Food Insecurity (06/23/2021)   Hunger Vital Sign    Worried About Running Out of Food in the Last Year: Never true    Ran Out of Food in the Last Year: Never true  Transportation Needs: No Transportation Needs (06/23/2021)   PRAPARE - Administrator, Civil Service (Medical): No    Lack of Transportation (Non-Medical): No  Physical Activity: Not on file  Stress: Not on file  Social Connections: Unknown (07/11/2021)   [facility]   Social Network    Social Network: Not on file  Intimate Partner Violence: Unknown (07/11/2021)   Received from [facility]   HITS    Physically Hurt: Not on file    Insult or Talk Down To: Not on file     Threaten Physical Harm: Not on file    Scream or Curse: Not on file    FAMILY HISTORY: Family History  Problem Relation Age of Onset   Throat cancer Father    Cancer Sister    Breast cancer Sister 36   Pancreatic cancer Brother        maternal half-brother   Throat cancer Brother        maternal half-brother   Breast cancer Paternal Aunt    Prostate cancer Paternal Uncle    Prostate cancer Cousin     ALLERGIES:  is allergic to codeine and lisinopril.  MEDICATIONS:  Current Outpatient Medications  Medication Sig Dispense Refill   amLODipine (NORVASC) 10 MG tablet Take 10 mg by mouth daily.     anastrozole  (ARIMIDEX ) 1 MG tablet Take 1 tablet (1 mg total) by mouth daily. 90 tablet 3   Cholecalciferol (VITAMIN D3) 250 MCG (10000 UT) capsule 10,000 Units daily.     loratadine (CLARITIN) 10 MG tablet Take 10 mg by mouth daily as needed for allergies.     losartan-hydrochlorothiazide (HYZAAR) 100-25 MG tablet Take 1 tablet by mouth daily.  metFORMIN (GLUCOPHAGE-XR) 500 MG 24 hr tablet 500 mg daily with breakfast.     pravastatin (PRAVACHOL) 40 MG tablet 1 tablet     No current facility-administered medications for this visit.     PHYSICAL EXAMINATION: ECOG PERFORMANCE STATUS: 0 - Asymptomatic  V/S and PE not done, telephone visit       LABORATORY DATA:  I have reviewed the data as listed Lab Results  Component Value Date   WBC 7.0 06/23/2021   HGB 12.3 06/23/2021   HCT 39.5 06/23/2021   MCV 77.3 (L) 06/23/2021   PLT 361 06/23/2021   Lab Results  Component Value Date   NA 140 07/02/2021   K 3.3 (L) 07/02/2021   CL 103 07/02/2021   CO2 27 07/02/2021    RADIOGRAPHIC STUDIES: I have personally reviewed the radiological reports and agreed with the findings in the report.  ASSESSMENT AND PLAN:   She is here for a telephone follow up to review US  results. US  didn't show any evidence of VTE. She feels well except for the swelling of the left  leg. Recommended that if the swelling persists, she may have to talk to her PCP for further recommendations. She will otherwise continue with anastrozole .   Total time spent: 10 minutes  I connected with  Orlean DELENA Chess on 09/21/23 by a telephone application and verified that I am speaking with the correct person using two identifiers.   I discussed the limitations of evaluation and management by telemedicine. The patient expressed understanding and agreed to proceed.  Location of pt: Home Location of provider: office.  All questions were answered. The patient knows to call the clinic with any problems, questions or concerns.    Amber Stalls, MD 09/21/23

## 2023-09-20 NOTE — Assessment & Plan Note (Signed)
 Assessment and Plan Assessment & Plan Breast cancer On anastrozole  since August 2023 without new side effects. March imaging showed dense breasts with superficial dermal thickening and a hypoechoic area, likely a cyst or post-surgical change. No suspicious masses. - Order bilateral mammogram and ultrasound for March 2026. - Refill anastrozole  prescription for one year.  Left leg pain and swelling Left leg pain and swelling for one month, differential includes DVT though not strongly suspected. Slight calf tenderness on flexion, no significant swelling difference between legs. - Order ultrasound of the left leg to rule out DVT. - Advise emergency room visit if increased swelling or sudden onset shortness of breath. - Schedule ultrasound for this week.

## 2023-10-02 ENCOUNTER — Ambulatory Visit: Payer: Self-pay | Admitting: Adult Health

## 2023-10-15 DIAGNOSIS — E669 Obesity, unspecified: Secondary | ICD-10-CM | POA: Diagnosis not present

## 2023-10-15 DIAGNOSIS — C50411 Malignant neoplasm of upper-outer quadrant of right female breast: Secondary | ICD-10-CM | POA: Diagnosis not present

## 2023-10-15 DIAGNOSIS — E66812 Obesity, class 2: Secondary | ICD-10-CM | POA: Diagnosis not present

## 2023-10-15 DIAGNOSIS — I10 Essential (primary) hypertension: Secondary | ICD-10-CM | POA: Diagnosis not present

## 2023-10-20 DIAGNOSIS — N1831 Chronic kidney disease, stage 3a: Secondary | ICD-10-CM | POA: Diagnosis not present

## 2023-10-20 DIAGNOSIS — E7800 Pure hypercholesterolemia, unspecified: Secondary | ICD-10-CM | POA: Diagnosis not present

## 2023-10-20 DIAGNOSIS — E6609 Other obesity due to excess calories: Secondary | ICD-10-CM | POA: Diagnosis not present

## 2023-10-20 DIAGNOSIS — E66811 Obesity, class 1: Secondary | ICD-10-CM | POA: Diagnosis not present

## 2023-10-20 DIAGNOSIS — M25562 Pain in left knee: Secondary | ICD-10-CM | POA: Diagnosis not present

## 2023-10-20 DIAGNOSIS — I10 Essential (primary) hypertension: Secondary | ICD-10-CM | POA: Diagnosis not present

## 2023-10-20 DIAGNOSIS — Z6834 Body mass index (BMI) 34.0-34.9, adult: Secondary | ICD-10-CM | POA: Diagnosis not present

## 2023-10-20 DIAGNOSIS — Z23 Encounter for immunization: Secondary | ICD-10-CM | POA: Diagnosis not present

## 2023-10-20 DIAGNOSIS — E1165 Type 2 diabetes mellitus with hyperglycemia: Secondary | ICD-10-CM | POA: Diagnosis not present

## 2023-10-20 DIAGNOSIS — C50411 Malignant neoplasm of upper-outer quadrant of right female breast: Secondary | ICD-10-CM | POA: Diagnosis not present

## 2023-10-30 ENCOUNTER — Encounter: Payer: Self-pay | Admitting: Podiatry

## 2023-10-30 ENCOUNTER — Ambulatory Visit (INDEPENDENT_AMBULATORY_CARE_PROVIDER_SITE_OTHER): Admitting: Podiatry

## 2023-10-30 DIAGNOSIS — M21619 Bunion of unspecified foot: Secondary | ICD-10-CM | POA: Diagnosis not present

## 2023-10-30 DIAGNOSIS — E119 Type 2 diabetes mellitus without complications: Secondary | ICD-10-CM

## 2023-10-30 DIAGNOSIS — M2010 Hallux valgus (acquired), unspecified foot: Secondary | ICD-10-CM

## 2023-10-30 NOTE — Progress Notes (Addendum)
 This patient presents to the office for diabetic foot exam.  Patient was referred to the office by   doctor.  This patient says there is no pain or discomfort in her feet.  No history of infection or drainage.  This patient presents to the office for foot exam due to having a history of diabetes.  Patient has bunions both feet.  Vascular  Dorsalis pedis and posterior tibial pulses are palpable  B/L.  Capillary return  WNL.  Temperature gradient is  WNL.  Skin turgor  WNL  Sensorium  Senn Weinstein monofilament wire  WNL. Normal tactile sensation.  Nail Exam  Patient has normal nails with no evidence of bacterial or fungal infection.  Orthopedic  Exam  Muscle tone and muscle strength  WNL.  No limitations of motion feet  B/L.  No crepitus or joint effusion noted.  Foot type is unremarkable and digits show no abnormalities.  HAV  B/L.  Skin  No open lesions.  Normal skin texture and turgor.   Diabetes with no complications  HAV  B/L.  Diabetic foot exam was performed.  There is no evidence of vascular or neurologic pathology.   HAV  B/L noted. RTC  6 months    Cordella Bold DPM

## 2023-11-06 ENCOUNTER — Ambulatory Visit: Attending: General Surgery

## 2023-11-06 VITALS — Wt 185.2 lb

## 2023-11-06 DIAGNOSIS — Z483 Aftercare following surgery for neoplasm: Secondary | ICD-10-CM | POA: Insufficient documentation

## 2023-11-06 NOTE — Therapy (Signed)
 OUTPATIENT PHYSICAL THERAPY SOZO SCREENING NOTE   Patient Name: Alexandria Hoover MRN: [id number] DOB:[birth year], 68 y.o., female Today's Date: 11/06/2023  PCP: Dyane Anthony RAMAN, FNP REFERRING PROVIDER: Ebbie Cough, MD   PT End of Session - 11/06/23 [phone]     Visit Number 5   # unchanged due to screen only   PT Start Time 0812    PT Stop Time 0816    PT Time Calculation (min) 4 min    Activity Tolerance Patient tolerated treatment well    Behavior During Therapy [facility] for tasks assessed/performed          Past Medical History:  Diagnosis Date   Breast cancer (HCC) 2023   Diabetes (HCC)    type 2   Heart murmur    History of radiation therapy    Right breast 08/16/21-09/08/21-Dr. Lynwood Nasuti   Hypercholesteremia    Hypertension    Personal history of radiation therapy    Past Surgical History:  Procedure Laterality Date   ABDOMINAL HYSTERECTOMY     BREAST BIOPSY Right 06/11/2021   BREAST EXCISIONAL BIOPSY Right over 10 yrs   BREAST LUMPECTOMY     BREAST LUMPECTOMY WITH RADIOACTIVE SEED AND SENTINEL LYMPH NODE BIOPSY Right 07/06/2021   Procedure: RIGHT BREAST LUMPECTOMY WITH RADIOACTIVE SEED AND AXILLARY SENTINEL LYMPH NODE BIOPSY;  Surgeon: Ebbie Cough, MD;  Location: [facility];  Service: General;  Laterality: Right;   TUBAL LIGATION     WISDOM TOOTH EXTRACTION     Patient Active Problem List   Diagnosis Date Noted   Pain due to onychomycosis of toenails of both feet 04/28/2023   Diabetes mellitus without complication (HCC) 04/28/2023   Genetic testing 06/30/2021   Family history of breast cancer 06/24/2021   Family history of prostate cancer 06/24/2021   Malignant neoplasm of upper-outer quadrant of right breast in female, estrogen receptor positive (HCC) 06/21/2021    REFERRING DIAG: right breast cancer at risk for lymphedema  THERAPY DIAG: Aftercare following surgery for neoplasm  PERTINENT HISTORY: Patient was diagnosed on  04/12/2021 with right grade 1 invasive ductal carcinoma breast cancer. It measures 1.7 cm and is located in the upper outer quadrant. It is ER/PR positive and HER2 negative with a Ki67 of 2%. Also has hypertension and diabetes. Pt had lumpectomy with 0/3 sentinel nodes removed on 07/06/2021 . Pt will be starting radiation on 08/02/2021 and completed on 09/08/21.   PRECAUTIONS: right UE Lymphedema risk, None  SUBJECTIVE: Pt returns for her last first 6 month L-Dex screen .  PAIN:  Are you having pain? No  SOZO SCREENING: Patient was assessed today using the SOZO machine to determine the lymphedema index score. This was compared to her baseline score. It was determined that she is within the recommended range when compared to her baseline and no further action is needed at this time. She will continue SOZO screenings. These are done every 3 months for 2 years post operatively followed by every 6 months for 2 years, and then annually.   L-DEX FLOWSHEETS - 11/06/23 0800       L-DEX LYMPHEDEMA SCREENING   Measurement Type Unilateral    L-DEX MEASUREMENT EXTREMITY Upper Extremity    POSITION  Standing    DOMINANT SIDE Right    At Risk Side Right    BASELINE SCORE (UNILATERAL) 0    L-DEX SCORE (UNILATERAL) -5.9    VALUE CHANGE (UNILAT) -5.9          P: Cont  6 month screens next.   Aden Berwyn Caldron, PTA 11/06/2023, 8:15 AM

## 2023-11-14 DIAGNOSIS — I10 Essential (primary) hypertension: Secondary | ICD-10-CM | POA: Diagnosis not present

## 2023-11-14 DIAGNOSIS — C50411 Malignant neoplasm of upper-outer quadrant of right female breast: Secondary | ICD-10-CM | POA: Diagnosis not present

## 2023-11-14 DIAGNOSIS — E66812 Obesity, class 2: Secondary | ICD-10-CM | POA: Diagnosis not present

## 2023-11-14 DIAGNOSIS — E669 Obesity, unspecified: Secondary | ICD-10-CM | POA: Diagnosis not present

## 2023-11-22 ENCOUNTER — Ambulatory Visit (INDEPENDENT_AMBULATORY_CARE_PROVIDER_SITE_OTHER): Admitting: Student

## 2023-11-22 ENCOUNTER — Other Ambulatory Visit (HOSPITAL_BASED_OUTPATIENT_CLINIC_OR_DEPARTMENT_OTHER): Payer: Self-pay

## 2023-11-22 DIAGNOSIS — M25562 Pain in left knee: Secondary | ICD-10-CM | POA: Diagnosis not present

## 2023-11-22 DIAGNOSIS — G8929 Other chronic pain: Secondary | ICD-10-CM

## 2023-11-22 MED ORDER — MELOXICAM 15 MG PO TABS
15.0000 mg | ORAL_TABLET | Freq: Every day | ORAL | 0 refills | Status: AC
  Filled 2023-11-22: qty 10, 10d supply, fill #0

## 2023-11-22 NOTE — Progress Notes (Signed)
 Chief Complaint: Left knee pain    Discussed the use of AI scribe software for clinical note transcription with the patient, who gave verbal consent to proceed.  History of Present Illness Alexandria Hoover is a 68 year old female who presents with left knee pain.  She has experienced left knee pain for four months, primarily at the anterior aspect, occasionally radiating posteriorly, with an average intensity of 5/10. There is no history of similar symptoms, injury, or overactivity. Biofreeze provides some relief, but she has not used Tylenol  or ibuprofen, although Tylenol  was recommended. No other medications have been taken. X-rays were done at a walk-in clinic a month ago without follow-up. The pain does not worsen with specific activities, but she has reduced walking due to discomfort. There is no swelling, popping, clicking, locking, or buckling of the knee.   Surgical History:   None  PMH/PSH/Family History/Social History/Meds/Allergies:    Past Medical History:  Diagnosis Date   Breast cancer (HCC) 2023   Diabetes (HCC)    type 2   Heart murmur    History of radiation therapy    Right breast 08/16/21-09/08/21-Dr. Lynwood Nasuti   Hypercholesteremia    Hypertension    Personal history of radiation therapy    Past Surgical History:  Procedure Laterality Date   ABDOMINAL HYSTERECTOMY     BREAST BIOPSY Right 06/11/2021   BREAST EXCISIONAL BIOPSY Right over 10 yrs   BREAST LUMPECTOMY     BREAST LUMPECTOMY WITH RADIOACTIVE SEED AND SENTINEL LYMPH NODE BIOPSY Right 07/06/2021   Procedure: RIGHT BREAST LUMPECTOMY WITH RADIOACTIVE SEED AND AXILLARY SENTINEL LYMPH NODE BIOPSY;  Surgeon: Ebbie Cough, MD;  Location: [facility];  Service: General;  Laterality: Right;   TUBAL LIGATION     WISDOM TOOTH EXTRACTION     Social History   Socioeconomic History   Marital status: Divorced    Spouse name: Not on file   Number of  children: 1   Years of education: Not on file   Highest education level: Not on file  Occupational History   Not on file  Tobacco Use   Smoking status: Never   Smokeless tobacco: Never  Vaping Use   Vaping status: Never Used  Substance and Sexual Activity   Alcohol use: Never   Drug use: Never   Sexual activity: Not on file  Other Topics Concern   Not on file  Social History Narrative   Not on file   Social Drivers of Health   Financial Resource Strain: Medium Risk (06/23/2021)   Overall Financial Resource Strain (CARDIA)    Difficulty of Paying Living Expenses: Somewhat hard  Food Insecurity: No Food Insecurity (06/23/2021)   Hunger Vital Sign    Worried About Running Out of Food in the Last Year: Never true    Ran Out of Food in the Last Year: Never true  Transportation Needs: No Transportation Needs (06/23/2021)   PRAPARE - Administrator, Civil Service (Medical): No    Lack of Transportation (Non-Medical): No  Physical Activity: Not on file  Stress: Not on file  Social Connections: Unknown (07/11/2021)   Received [facility]   Social Network    Social Network: Not on file   Family History  Problem Relation Age of Onset   Throat  cancer Father    Cancer Sister    Breast cancer Sister 66   Pancreatic cancer Brother        maternal half-brother   Throat cancer Brother        maternal half-brother   Breast cancer Paternal Aunt    Prostate cancer Paternal Uncle    Prostate cancer Cousin    Allergies  Allergen Reactions   Codeine Nausea And Vomiting   Lisinopril Other (See Comments)    cough   Current Outpatient Medications  Medication Sig Dispense Refill   meloxicam (MOBIC) 15 MG tablet Take 1 tablet (15 mg total) by mouth daily for 10 days. 10 tablet 0   amLODipine (NORVASC) 10 MG tablet Take 10 mg by mouth daily.     anastrozole  (ARIMIDEX ) 1 MG tablet Take 1 tablet (1 mg total) by mouth daily. 90 tablet 3   Cholecalciferol (VITAMIN D3) 250  MCG (10000 UT) capsule 10,000 Units daily.     loratadine (CLARITIN) 10 MG tablet Take 10 mg by mouth daily as needed for allergies.     losartan-hydrochlorothiazide (HYZAAR) 100-25 MG tablet Take 1 tablet by mouth daily.     metFORMIN (GLUCOPHAGE-XR) 500 MG 24 hr tablet 500 mg daily with breakfast.     pravastatin (PRAVACHOL) 40 MG tablet 1 tablet     No current facility-administered medications for this visit.   No results found.  Review of Systems:   A ROS was performed including pertinent positives and negatives as documented in the HPI.  Physical Exam :   Constitutional: NAD and appears stated age Neurological: Alert and oriented Psych: Appropriate affect and cooperative There were no vitals taken for this visit.   Comprehensive Musculoskeletal Exam:    Exam of the left knee demonstrates active range of motion from 0 to 120 degrees without crepitus.  Stable collaterals with varus and valgus stress.  No effusion present without overlying erythema or warmth.  No medial or lateral joint line tenderness.  Flexion and extension strength is 5/5.  Imaging:   Xray reviewed from urgent care (left knee 3 views): Mild degenerative changes within the medial and patellofemoral compartments with small osteophyte formation   I personally reviewed and interpreted the radiographs.      Assessment & Plan Left knee pain  Patient has 49-month history of atraumatic left knee pain.  X-rays were reviewed today from 1 month ago at urgent care which show mild patellofemoral and medial compartment degenerative changes.  At this time considering patellofemoral pain versus degenerative changes as a source of her discomfort.  Will prescribe 10-day course of meloxicam, although caution is used due to underlying stage IIIa CKD therefore would like to avoid refilling this.  Recommend continued physical activity and strengthening.  Discussed Voltaren gel as option for long-term management. Instruct to monitor  symptoms and return if pain worsens or persists for further evaluation.       I personally saw and evaluated the patient, and participated in the management and treatment plan.  Leonce Reveal, PA-C Orthopedics

## 2023-12-13 ENCOUNTER — Other Ambulatory Visit: Payer: Self-pay | Admitting: Internal Medicine

## 2023-12-13 DIAGNOSIS — N1831 Chronic kidney disease, stage 3a: Secondary | ICD-10-CM

## 2023-12-18 ENCOUNTER — Encounter: Payer: Self-pay | Admitting: Radiology

## 2023-12-19 ENCOUNTER — Ambulatory Visit
Admission: RE | Admit: 2023-12-19 | Discharge: 2023-12-19 | Disposition: A | Discharge status: Home / Self Care | Source: Ambulatory Visit | Attending: Internal Medicine | Admitting: Internal Medicine

## 2023-12-19 DIAGNOSIS — N1831 Chronic kidney disease, stage 3a: Secondary | ICD-10-CM

## 2024-02-16 ENCOUNTER — Other Ambulatory Visit: Payer: Self-pay | Admitting: Hematology and Oncology

## 2024-02-16 DIAGNOSIS — C50411 Malignant neoplasm of upper-outer quadrant of right female breast: Secondary | ICD-10-CM

## 2024-04-22 ENCOUNTER — Other Ambulatory Visit

## 2024-04-22 ENCOUNTER — Encounter

## 2024-04-29 ENCOUNTER — Ambulatory Visit: Admitting: Podiatry

## 2024-05-06 ENCOUNTER — Ambulatory Visit: Attending: General Surgery

## 2024-09-06 ENCOUNTER — Inpatient Hospital Stay: Admitting: Hematology and Oncology
